# Patient Record
Sex: Female | Born: 1950 | Race: Black or African American | Hispanic: No | Smoking: Never smoker
Health system: Southern US, Community
[De-identification: ages and names within clinical notes are randomized; demographics above are authoritative.]

## PROBLEM LIST (undated history)

## (undated) DIAGNOSIS — I10 Essential (primary) hypertension: Secondary | ICD-10-CM

## (undated) DIAGNOSIS — E785 Hyperlipidemia, unspecified: Secondary | ICD-10-CM

## (undated) HISTORY — DX: Essential (primary) hypertension: I10

## (undated) HISTORY — DX: Hyperlipidemia, unspecified: E78.5

## (undated) HISTORY — PX: ABDOMINAL HYSTERECTOMY: SHX81

---

## 1999-12-14 ENCOUNTER — Other Ambulatory Visit: Admission: RE | Admit: 1999-12-14 | Discharge: 1999-12-14 | Payer: Self-pay | Admitting: Internal Medicine

## 1999-12-24 ENCOUNTER — Ambulatory Visit (HOSPITAL_COMMUNITY): Admission: RE | Admit: 1999-12-24 | Discharge: 1999-12-24 | Payer: Self-pay | Admitting: Internal Medicine

## 1999-12-24 ENCOUNTER — Encounter: Payer: Self-pay | Admitting: Internal Medicine

## 2005-03-15 ENCOUNTER — Ambulatory Visit (HOSPITAL_COMMUNITY): Admission: RE | Admit: 2005-03-15 | Discharge: 2005-03-15 | Payer: Self-pay | Admitting: Gastroenterology

## 2013-10-31 ENCOUNTER — Ambulatory Visit: Payer: 59

## 2013-10-31 ENCOUNTER — Ambulatory Visit (INDEPENDENT_AMBULATORY_CARE_PROVIDER_SITE_OTHER): Payer: 59 | Admitting: Family Medicine

## 2013-10-31 VITALS — BP 142/86 | HR 82 | Temp 98.9°F | Resp 16 | Ht 62.5 in | Wt 182.0 lb

## 2013-10-31 DIAGNOSIS — R059 Cough, unspecified: Secondary | ICD-10-CM

## 2013-10-31 DIAGNOSIS — R05 Cough: Secondary | ICD-10-CM

## 2013-10-31 DIAGNOSIS — J209 Acute bronchitis, unspecified: Secondary | ICD-10-CM

## 2013-10-31 LAB — POCT CBC
Granulocyte percent: 65.7 %G (ref 37–80)
HCT, POC: 40.9 % (ref 37.7–47.9)
Hemoglobin: 12.8 g/dL (ref 12.2–16.2)
Lymph, poc: 1.8 (ref 0.6–3.4)
MCH, POC: 29.2 pg (ref 27–31.2)
MCHC: 31.3 g/dL — AB (ref 31.8–35.4)
MCV: 93.1 fL (ref 80–97)
MID (cbc): 0.5 (ref 0–0.9)
MPV: 8.7 fL (ref 0–99.8)
POC Granulocyte: 4.4 (ref 2–6.9)
POC LYMPH PERCENT: 27.5 %L (ref 10–50)
POC MID %: 6.8 %M (ref 0–12)
Platelet Count, POC: 250 10*3/uL (ref 142–424)
RBC: 4.39 M/uL (ref 4.04–5.48)
RDW, POC: 13.9 %
WBC: 6.7 10*3/uL (ref 4.6–10.2)

## 2013-10-31 MED ORDER — ALBUTEROL SULFATE (2.5 MG/3ML) 0.083% IN NEBU
2.5000 mg | INHALATION_SOLUTION | Freq: Once | RESPIRATORY_TRACT | Status: AC
  Administered 2013-10-31: 2.5 mg via RESPIRATORY_TRACT

## 2013-10-31 MED ORDER — IPRATROPIUM BROMIDE 0.03 % NA SOLN: 2.0000 | Freq: Four times a day (QID) | NASAL | Status: DC

## 2013-10-31 MED ORDER — ALBUTEROL SULFATE HFA 108 (90 BASE) MCG/ACT IN AERS: 2.0000 | INHALATION_SPRAY | RESPIRATORY_TRACT | Status: DC | PRN

## 2013-10-31 MED ORDER — GUAIFENESIN ER 1200 MG PO TB12: 1.0000 | ORAL_TABLET | Freq: Two times a day (BID) | ORAL | Status: DC | PRN

## 2013-10-31 MED ORDER — HYDROCOD POLST-CHLORPHEN POLST 10-8 MG/5ML PO LQCR: 5.0000 mL | Freq: Two times a day (BID) | ORAL | Status: DC | PRN

## 2013-10-31 NOTE — Progress Notes (Signed)
Subjective:    Patient ID: Christina Duncan, female    DOB: 1950-11-21, 63 y.o.   MRN: [id number] Chief Complaint  Patient presents with  . Cough    x 4 day   HPI  This started w/ a little cough 4d ago then the next day developed a scratchy throat - started gargles. But that night she developed a subj f/c/sweats and her sxs worsen.  Coughing up yellow-green mucous w/ blood - same coming from her nose.  Her chest is wheezing.  Is not feeling SHoB, no CP though ribs are sore.  Not sleeping well due to sxs.  Left ear is hurting and HA, no sinus pressure and jaw pain.  No n/v.  Tol po, Nml GI/GU. Uing ricola cough drops and coricidin HBP for cold and flu.   Past Medical History  Diagnosis Date  . Hypertension    No current outpatient prescriptions on file prior to visit.   No current facility-administered medications on file prior to visit.   Allergies  Allergen Reactions  . Azithromycin Rash    Review of Systems  Constitutional: Positive for fever, chills, diaphoresis, activity change, appetite change and fatigue.  HENT: Positive for congestion, ear pain, nosebleeds, postnasal drip, rhinorrhea and sore throat. Negative for dental problem, hearing loss and sinus pressure.   Respiratory: Positive for cough and wheezing. Negative for chest tightness and shortness of breath.   Cardiovascular: Negative for chest pain, palpitations and leg swelling.  Gastrointestinal: Negative for nausea, vomiting, abdominal pain, diarrhea and constipation.  Genitourinary: Negative for dysuria, urgency, decreased urine volume and difficulty urinating.  Musculoskeletal: Positive for arthralgias. Negative for back pain.  Neurological: Positive for headaches.  Hematological: Negative for adenopathy.  Psychiatric/Behavioral: Positive for sleep disturbance.      BP 142/86  Pulse 82  Temp(Src) 98.9 F (37.2 C) (Oral)  Resp 16  Ht 5' 2.5" (1.588 m)  Wt 182 lb (82.555 kg)  BMI 32.74 kg/m2  SpO2  99% Objective:   Physical Exam  Constitutional: She is oriented to person, place, and time. She appears well-developed and well-nourished. No distress.  HENT:  Head: Normocephalic and atraumatic.  Right Ear: External ear and ear canal normal. A middle ear effusion is present.  Left Ear: External ear and ear canal normal. A middle ear effusion is present.  Nose: Mucosal edema and rhinorrhea present.  Mouth/Throat: Uvula is midline and mucous membranes are normal. Posterior oropharyngeal erythema present. No oropharyngeal exudate or posterior oropharyngeal edema.  Right ear w/ moderate amount of cerumen Left nare w/ friability  Eyes: Conjunctivae are normal. Right eye exhibits no discharge. Left eye exhibits no discharge. No scleral icterus.  Neck: Normal range of motion. Neck supple.  Cardiovascular: Normal rate, regular rhythm, normal heart sounds and intact distal pulses.   Pulmonary/Chest: Effort normal and breath sounds normal.  Lymphadenopathy:       Head (right side): No submandibular, no tonsillar, no preauricular and no posterior auricular adenopathy present.       Head (left side): No submandibular, no tonsillar, no preauricular and no posterior auricular adenopathy present.    She has no cervical adenopathy.       Right: No supraclavicular adenopathy present.       Left: No supraclavicular adenopathy present.  Neurological: She is alert and oriented to person, place, and time.  Skin: Skin is warm and dry. She is not diaphoretic. No erythema.  Psychiatric: She has a normal mood and affect. Her behavior is normal.  Results for orders placed in visit on 10/31/13  POCT CBC      Result Value Range   WBC 6.7  4.6 - 10.2 K/uL   Lymph, poc 1.8  0.6 - 3.4   POC LYMPH PERCENT 27.5  10 - 50 %L   MID (cbc) 0.5  0 - 0.9   POC MID % 6.8  0 - 12 %M   POC Granulocyte 4.4  2 - 6.9   Granulocyte percent 65.7  37 - 80 %G   RBC 4.39  4.04 - 5.48 M/uL   Hemoglobin 12.8  12.2 - 16.2 g/dL    HCT, POC 28.440.9  13.237.7 - 47.9 %   MCV 93.1  80 - 97 fL   MCH, POC 29.2  27 - 31.2 pg   MCHC 31.3 (*) 31.8 - 35.4 g/dL   RDW, POC 44.013.9     Platelet Count, POC 250  142 - 424 K/uL   MPV 8.7  0 - 99.8 fL      [facility] reading (PRIMARY) by  Dr. Clelia CroftShaw. CXR: no acute abnormality  EXAM: CHEST 2 VIEW  COMPARISON: None.  FINDINGS: The heart size and mediastinal contours are within normal limits. Both lungs are clear. The visualized skeletal structures are unremarkable.  IMPRESSION: No active cardiopulmonary disease.  Assessment & Plan:   Cough - Plan: POCT CBC, DG Chest 2 View, albuterol (PROVENTIL) (2.5 MG/3ML) 0.083% nebulizer solution 2.5 mg  Acute bronchitis - strongly suspect viral origin - symptomatic care.  Meds ordered this encounter  Medications  . DISCONTD: amLODipine (NORVASC) 10 MG tablet    Sig: Take 10 mg by mouth daily.  Marland Kitchen. amLODipine-benazepril (LOTREL) 10-20 MG per capsule    Sig: Take 1 capsule by mouth daily.  Marland Kitchen. albuterol (PROVENTIL) (2.5 MG/3ML) 0.083% nebulizer solution 2.5 mg    Sig:   . chlorpheniramine-HYDROcodone (TUSSIONEX PENNKINETIC ER) 10-8 MG/5ML LQCR    Sig: Take 5 mLs by mouth every 12 (twelve) hours as needed.    Dispense:  120 mL    Refill:  0  . albuterol (PROVENTIL HFA;VENTOLIN HFA) 108 (90 BASE) MCG/ACT inhaler    Sig: Inhale 2 puffs into the lungs every 4 (four) hours as needed for wheezing or shortness of breath (cough, shortness of breath or wheezing.).    Dispense:  1 Inhaler    Refill:  1  . ipratropium (ATROVENT) 0.03 % nasal spray    Sig: Place 2 sprays into the nose 4 (four) times daily.    Dispense:  30 mL    Refill:  1  . Guaifenesin (MUCINEX MAXIMUM STRENGTH) 1200 MG TB12    Sig: Take 1 tablet (1,200 mg total) by mouth every 12 (twelve) hours as needed.    Dispense:  14 tablet    Refill:  1    I personally performed the services described in this documentation, which was scribed in my presence. The recorded information has been  reviewed and considered, and addended by me as needed.  Norberto SorensonEva Nury Nebergall, MD MPH

## 2013-10-31 NOTE — Patient Instructions (Signed)
Acute Bronchitis Bronchitis is inflammation of the airways that extend from the windpipe into the lungs (bronchi). The inflammation often causes mucus to develop. This leads to a cough, which is the most common symptom of bronchitis.  In acute bronchitis, the condition usually develops suddenly and goes away over time, usually in a couple weeks. Smoking, allergies, and asthma can make bronchitis worse. Repeated episodes of bronchitis may cause further lung problems.  CAUSES Acute bronchitis is most often caused by the same virus that causes a cold. The virus can spread from person to person (contagious).  SIGNS AND SYMPTOMS   Cough.   Fever.   Coughing up mucus.   Body aches.   Chest congestion.   Chills.   Shortness of breath.   Sore throat.  DIAGNOSIS  Acute bronchitis is usually diagnosed through a physical exam. Tests, such as chest X-rays, are sometimes done to rule out other conditions.  TREATMENT  Acute bronchitis usually goes away in a couple weeks. Often times, no medical treatment is necessary. Medicines are sometimes given for relief of fever or cough. Antibiotics are usually not needed but may be prescribed in certain situations. In some cases, an inhaler may be recommended to help reduce shortness of breath and control the cough. A cool mist vaporizer may also be used to help thin bronchial secretions and make it easier to clear the chest.  HOME CARE INSTRUCTIONS  Get plenty of rest.   Drink enough fluids to keep your urine clear or pale yellow (unless you have a medical condition that requires fluid restriction). Increasing fluids may help thin your secretions and will prevent dehydration.   Only take over-the-counter or prescription medicines as directed by your health care provider.   Avoid smoking and secondhand smoke. Exposure to cigarette smoke or irritating chemicals will make bronchitis worse. If you are a smoker, consider using nicotine gum or skin  patches to help control withdrawal symptoms. Quitting smoking will help your lungs heal faster.   Reduce the chances of another bout of acute bronchitis by washing your hands frequently, avoiding people with cold symptoms, and trying not to touch your hands to your mouth, nose, or eyes.   Follow up with your health care provider as directed.  SEEK MEDICAL CARE IF: Your symptoms do not improve after 1 week of treatment.  SEEK IMMEDIATE MEDICAL CARE IF:  You develop an increased fever or chills.   You have chest pain.   You have severe shortness of breath.  You have bloody sputum.   You develop dehydration.  You develop fainting.  You develop repeated vomiting.  You develop a severe headache. MAKE SURE YOU:   Understand these instructions.  Will watch your condition.  Will get help right away if you are not doing well or get worse. Document Released: 11/14/2004 Document Revised: 06/09/2013 Document Reviewed: 03/30/2013 [employer] Patient Information 2014 [employer].  

## 2013-11-16 ENCOUNTER — Other Ambulatory Visit (HOSPITAL_COMMUNITY): Payer: Self-pay | Admitting: Internal Medicine

## 2013-11-16 DIAGNOSIS — Z1231 Encounter for screening mammogram for malignant neoplasm of breast: Secondary | ICD-10-CM

## 2013-11-17 ENCOUNTER — Ambulatory Visit (HOSPITAL_COMMUNITY)
Admission: RE | Admit: 2013-11-17 | Discharge: 2013-11-17 | Disposition: A | Discharge status: Home / Self Care | Payer: 59 | Source: Ambulatory Visit | Attending: Internal Medicine | Admitting: Internal Medicine

## 2013-11-17 DIAGNOSIS — Z1231 Encounter for screening mammogram for malignant neoplasm of breast: Secondary | ICD-10-CM

## 2013-11-19 ENCOUNTER — Other Ambulatory Visit: Payer: Self-pay | Admitting: Internal Medicine

## 2013-11-19 DIAGNOSIS — R928 Other abnormal and inconclusive findings on diagnostic imaging of breast: Secondary | ICD-10-CM

## 2013-11-29 ENCOUNTER — Other Ambulatory Visit: Payer: 59

## 2013-11-29 ENCOUNTER — Ambulatory Visit
Admission: RE | Admit: 2013-11-29 | Discharge: 2013-11-29 | Disposition: A | Discharge status: Home / Self Care | Payer: 59 | Source: Ambulatory Visit | Attending: Internal Medicine | Admitting: Internal Medicine

## 2013-11-29 DIAGNOSIS — R928 Other abnormal and inconclusive findings on diagnostic imaging of breast: Secondary | ICD-10-CM

## 2013-12-31 ENCOUNTER — Other Ambulatory Visit: Payer: Self-pay | Admitting: Family Medicine

## 2014-01-11 ENCOUNTER — Ambulatory Visit (INDEPENDENT_AMBULATORY_CARE_PROVIDER_SITE_OTHER): Payer: 59 | Admitting: Internal Medicine

## 2014-01-11 VITALS — BP 142/90 | HR 83 | Temp 98.2°F | Resp 18 | Ht 63.0 in | Wt 186.0 lb

## 2014-01-11 DIAGNOSIS — J029 Acute pharyngitis, unspecified: Secondary | ICD-10-CM

## 2014-01-11 DIAGNOSIS — J40 Bronchitis, not specified as acute or chronic: Secondary | ICD-10-CM

## 2014-01-11 MED ORDER — HYDROCODONE-ACETAMINOPHEN 7.5-325 MG/15ML PO SOLN: 5.0000 mL | Freq: Four times a day (QID) | ORAL | Status: DC | PRN

## 2014-01-11 MED ORDER — DOXYCYCLINE HYCLATE 100 MG PO TABS: 100.0000 mg | ORAL_TABLET | Freq: Two times a day (BID) | ORAL | Status: DC

## 2014-01-11 NOTE — Patient Instructions (Signed)
Acute Bronchitis Bronchitis is inflammation of the airways that extend from the windpipe into the lungs (bronchi). The inflammation often causes mucus to develop. This leads to a cough, which is the most common symptom of bronchitis.  In acute bronchitis, the condition usually develops suddenly and goes away over time, usually in a couple weeks. Smoking, allergies, and asthma can make bronchitis worse. Repeated episodes of bronchitis may cause further lung problems.  CAUSES Acute bronchitis is most often caused by the same virus that causes a cold. The virus can spread from person to person (contagious).  SIGNS AND SYMPTOMS   Cough.   Fever.   Coughing up mucus.   Body aches.   Chest congestion.   Chills.   Shortness of breath.   Sore throat.  DIAGNOSIS  Acute bronchitis is usually diagnosed through a physical exam. Tests, such as chest X-rays, are sometimes done to rule out other conditions.  TREATMENT  Acute bronchitis usually goes away in a couple weeks. Often times, no medical treatment is necessary. Medicines are sometimes given for relief of fever or cough. Antibiotics are usually not needed but may be prescribed in certain situations. In some cases, an inhaler may be recommended to help reduce shortness of breath and control the cough. A cool mist vaporizer may also be used to help thin bronchial secretions and make it easier to clear the chest.  HOME CARE INSTRUCTIONS  Get plenty of rest.   Drink enough fluids to keep your urine clear or pale yellow (unless you have a medical condition that requires fluid restriction). Increasing fluids may help thin your secretions and will prevent dehydration.   Only take over-the-counter or prescription medicines as directed by your health care provider.   Avoid smoking and secondhand smoke. Exposure to cigarette smoke or irritating chemicals will make bronchitis worse. If you are a smoker, consider using nicotine gum or skin  patches to help control withdrawal symptoms. Quitting smoking will help your lungs heal faster.   Reduce the chances of another bout of acute bronchitis by washing your hands frequently, avoiding people with cold symptoms, and trying not to touch your hands to your mouth, nose, or eyes.   Follow up with your health care provider as directed.  SEEK MEDICAL CARE IF: Your symptoms do not improve after 1 week of treatment.  SEEK IMMEDIATE MEDICAL CARE IF:  You develop an increased fever or chills.   You have chest pain.   You have severe shortness of breath.  You have bloody sputum.   You develop dehydration.  You develop fainting.  You develop repeated vomiting.  You develop a severe headache. MAKE SURE YOU:   Understand these instructions.  Will watch your condition.  Will get help right away if you are not doing well or get worse. Document Released: 11/14/2004 Document Revised: 06/09/2013 Document Reviewed: 03/30/2013 [employer] Patient Information 2014 [employer].  

## 2014-01-11 NOTE — Progress Notes (Signed)
   Subjective:    Patient ID: Christina Duncan, female    DOB: [birth year], 63 y.o.   MRN: [id number]  HPI Christina Duncan presents today with a cough, nasal congestion, and throat irritation. She has been sick for the past three days. She states it started with a scratchy throat. She has yellow mucous that she coughs up. No fever has been noted. She has been using hall cough drops and a nasal spray to assist with her symptoms.   Normal cxr in January.   Review of Systems     Objective:   Physical Exam  Vitals reviewed. Constitutional: She is oriented to person, place, and time. She appears well-developed and well-nourished. No distress.  HENT:  Head: Normocephalic.  Right Ear: External ear normal.  Left Ear: External ear normal.  Nose: Mucosal edema and rhinorrhea present.  Mouth/Throat: Oropharynx is clear and moist.  Eyes: EOM are normal.  Neck: Normal range of motion.  Cardiovascular: Normal rate.   Pulmonary/Chest: Effort normal. Not tachypneic. She has no decreased breath sounds. She has no wheezes. She has rhonchi. She has no rales.  Neurological: She is alert and oriented to person, place, and time. She exhibits normal muscle tone. Coordination normal.  Psychiatric: She has a normal mood and affect.          Assessment & Plan:  Doxycycline 100mg  bid/lortab elixir prn

## 2014-01-31 ENCOUNTER — Ambulatory Visit (INDEPENDENT_AMBULATORY_CARE_PROVIDER_SITE_OTHER): Payer: 59 | Admitting: Physician Assistant

## 2014-01-31 VITALS — BP 134/80 | HR 82 | Temp 98.1°F | Resp 18 | Ht 63.0 in | Wt 184.0 lb

## 2014-01-31 DIAGNOSIS — R3915 Urgency of urination: Secondary | ICD-10-CM

## 2014-01-31 DIAGNOSIS — R350 Frequency of micturition: Secondary | ICD-10-CM

## 2014-01-31 DIAGNOSIS — I10 Essential (primary) hypertension: Secondary | ICD-10-CM | POA: Insufficient documentation

## 2014-01-31 LAB — POCT URINALYSIS DIPSTICK
Bilirubin, UA: NEGATIVE
Glucose, UA: NEGATIVE
KETONES UA: NEGATIVE
NITRITE UA: NEGATIVE
PH UA: 6.5
Spec Grav, UA: 1.025
UROBILINOGEN UA: 0.2

## 2014-01-31 LAB — POCT UA - MICROSCOPIC ONLY
CASTS, UR, LPF, POC: NEGATIVE
Crystals, Ur, HPF, POC: NEGATIVE
Mucus, UA: NEGATIVE
Yeast, UA: NEGATIVE

## 2014-01-31 MED ORDER — CIPROFLOXACIN HCL 250 MG PO TABS
250.0000 mg | ORAL_TABLET | Freq: Two times a day (BID) | ORAL | Status: DC
Start: 2014-01-31 — End: 2014-11-17

## 2014-01-31 NOTE — Progress Notes (Signed)
I have examined this patient along with the student and agree.  

## 2014-01-31 NOTE — Progress Notes (Signed)
Subjective:    Patient ID: Christina Duncan, female    DOB: [birth year], 63 y.o.   MRN: [id number]  Urinary Frequency  Associated symptoms include frequency. Pertinent negatives include no flank pain.    62y.o female with hx of HTN for which she takes Lotrel presents with 3 days of urinary pressure at the end of urination, and increase in urinary frequency.  She describes waiting a long time until she got home to use the bathroom 3 days ago and since then has had a painful pressure at the end of urination.  She has also been urinating more than usual without changing intake of fluids.  Feels like she is not completely emptying bladder she does not go much each time. Denies burning, cloudiness, odor, blood, flank pain, vaginal discharge, sexual activity.  No thirsty feeling or family hx of diabetes.  No hx of urinary tract infections.  Denies fever, N/V/D.  Review of Systems  Constitutional: Negative.   HENT: Negative.   Eyes: Negative.   Respiratory: Negative.   Cardiovascular: Negative.   Gastrointestinal: Negative.   Endocrine: Negative.   Genitourinary: Positive for dysuria and frequency. Negative for flank pain.  Musculoskeletal: Negative.   Skin: Negative for rash.  Allergic/Immunologic: Negative.   Neurological: Negative.        Objective:   Physical Exam  Constitutional: She is oriented to person, place, and time. She appears well-developed and well-nourished. No distress.  HENT:  Head: Normocephalic.  Eyes: Conjunctivae are normal. Pupils are equal, round, and reactive to light.  Cardiovascular: Normal rate, regular rhythm, normal heart sounds and intact distal pulses.  Exam reveals no gallop and no friction rub.   No murmur heard. Pulmonary/Chest: Effort normal and breath sounds normal.  Abdominal: Soft. Bowel sounds are normal. There is tenderness (suprapubic) in the suprapubic area. There is no CVA tenderness.  Neurological: She is oriented to person, place, and time.    Psychiatric: She has a normal mood and affect. Her behavior is normal.    Results for orders placed in visit on 01/31/14  POCT URINALYSIS DIPSTICK      Result Value Ref Range   Color, UA yellow     Clarity, UA clear     Glucose, UA neg     Bilirubin, UA neg     Ketones, UA neg     Spec Grav, UA 1.025     Blood, UA large     pH, UA 6.5     Protein, UA >=300     Urobilinogen, UA 0.2     Nitrite, UA neg     Leukocytes, UA small (1+)    POCT UA - MICROSCOPIC ONLY      Result Value Ref Range   WBC, Ur, HPF, POC tntc     RBC, urine, microscopic tntc     Bacteria, U Microscopic 1+     Mucus, UA neg     Epithelial cells, urine per micros 0-2     Crystals, Ur, HPF, POC neg     Casts, Ur, LPF, POC neg     Yeast, UA neg           Assessment & Plan:   1. Frequent urination 2. Urgency of urination Likely UTI.  Will treat empirically with Cipro.  Awaiting culture results. - POCT urinalysis dipstick - POCT UA - Microscopic Only - Urine culture - ciprofloxacin (CIPRO) 250 MG tablet; Take 1 tablet (250 mg total) by mouth 2 (two) times daily.  Dispense: 10 tablet; Refill: 0

## 2014-01-31 NOTE — Patient Instructions (Signed)
I will contact you with your lab results as soon as they are available.   If you have not heard from me in 2 weeks, please contact me.  The fastest way to get your results is to register for My Chart (see the instructions on the last page of this printout).   

## 2014-02-02 LAB — URINE CULTURE

## 2014-11-17 ENCOUNTER — Ambulatory Visit (INDEPENDENT_AMBULATORY_CARE_PROVIDER_SITE_OTHER): Payer: 59 | Admitting: Physician Assistant

## 2014-11-17 VITALS — BP 144/86 | HR 73 | Temp 98.1°F | Resp 18 | Ht 62.0 in | Wt 186.8 lb

## 2014-11-17 DIAGNOSIS — R042 Hemoptysis: Secondary | ICD-10-CM

## 2014-11-17 DIAGNOSIS — S0120XA Unspecified open wound of nose, initial encounter: Secondary | ICD-10-CM

## 2014-11-17 NOTE — Patient Instructions (Signed)
1. Use over the counter nasal saline spray throughout day. Additionally use right before bed.

## 2014-11-17 NOTE — Progress Notes (Signed)
   Subjective:    Patient ID: Christina Duncan, female    DOB: 23-Nov-1950, 64 y.o.   MRN: [id number]  HPI Patient presents for blood in mucus that came out when she blew her nose and forced mucus to expel from mouth. Denies recent URI sx, fever, trauma, h/o nosebleeds, recent travel outside country, or known exposure to individual with TB. Has gas heating and air and feels that nose is very dry. Nose did not continue to bleed or no pain in nose.   BP slightly elevated in office and was also at home. Felt it was bc she was worried this event was serious. Took BP meds this morning and has follow up with PCP next week.   Review of Systems  Constitutional: Negative for fever.  HENT: Negative for congestion, ear pain, nosebleeds, rhinorrhea, sinus pressure, sneezing and sore throat.   Eyes: Negative for pain and discharge.  Respiratory: Negative for cough, chest tightness, shortness of breath and wheezing.   Cardiovascular: Negative for chest pain.  Neurological: Negative for dizziness and headaches.       Objective:   Physical Exam  Constitutional: She is oriented to person, place, and time. She appears well-developed and well-nourished. No distress.  Blood pressure 144/86, pulse 73, temperature 98.1 F (36.7 C), temperature source Oral, resp. rate 18, height 5\' 2"  (1.575 m), weight 186 lb 12.8 oz (84.732 kg), SpO2 98 %.  HENT:  Head: Normocephalic and atraumatic.  Right Ear: Tympanic membrane, external ear and ear canal normal.  Left Ear: Tympanic membrane, external ear and ear canal normal.  Nose: Nose lacerations (large scab present in left nares with small amount of blood anterior to scab) present. No mucosal edema, rhinorrhea, sinus tenderness, nasal deformity, septal deviation or nasal septal hematoma. Right sinus exhibits no maxillary sinus tenderness and no frontal sinus tenderness. Left sinus exhibits no maxillary sinus tenderness and no frontal sinus tenderness.  Mouth/Throat: Uvula  is midline, oropharynx is clear and moist and mucous membranes are normal. No oropharyngeal exudate.  Eyes: Conjunctivae are normal. Right eye exhibits no discharge. Left eye exhibits no discharge. No scleral icterus.  Cardiovascular: Normal rate, regular rhythm and normal heart sounds.  Exam reveals no gallop and no friction rub.   No murmur heard. Pulmonary/Chest: Effort normal and breath sounds normal. No respiratory distress. She has no decreased breath sounds. She has no wheezes. She has no rhonchi. She has no rales.  Neurological: She is alert and oriented to person, place, and time.  Skin: Skin is warm and dry. No rash noted. She is not diaphoretic. No erythema. No pallor.      Assessment & Plan:  1. Open wound, nose, without complication, initial encounter 2. Bloody sputum Nosebleed in sleep likely, especially, since has gas heating system. Should use nasal saline spray to keep nares moist. Should definitely use right before bed.   Christina Ridgeishira Zenobia Kuennen PA-C  [facility] 11/17/2014 12:58 PM

## 2015-06-22 ENCOUNTER — Other Ambulatory Visit: Payer: Self-pay

## 2015-06-22 DIAGNOSIS — Z1231 Encounter for screening mammogram for malignant neoplasm of breast: Secondary | ICD-10-CM

## 2015-07-04 ENCOUNTER — Ambulatory Visit
Admission: RE | Admit: 2015-07-04 | Discharge: 2015-07-04 | Disposition: A | Discharge status: Home / Self Care | Payer: Commercial Managed Care - HMO | Source: Ambulatory Visit

## 2015-07-04 DIAGNOSIS — Z1231 Encounter for screening mammogram for malignant neoplasm of breast: Secondary | ICD-10-CM

## 2016-06-11 DIAGNOSIS — E668 Other obesity: Secondary | ICD-10-CM | POA: Diagnosis not present

## 2016-06-11 DIAGNOSIS — I10 Essential (primary) hypertension: Secondary | ICD-10-CM | POA: Diagnosis not present

## 2016-06-11 DIAGNOSIS — E559 Vitamin D deficiency, unspecified: Secondary | ICD-10-CM | POA: Diagnosis not present

## 2016-06-11 DIAGNOSIS — E7800 Pure hypercholesterolemia, unspecified: Secondary | ICD-10-CM | POA: Diagnosis not present

## 2016-06-11 DIAGNOSIS — Z23 Encounter for immunization: Secondary | ICD-10-CM | POA: Diagnosis not present

## 2016-06-11 DIAGNOSIS — Z6834 Body mass index (BMI) 34.0-34.9, adult: Secondary | ICD-10-CM | POA: Diagnosis not present

## 2016-07-15 ENCOUNTER — Other Ambulatory Visit: Payer: Self-pay | Admitting: Internal Medicine

## 2016-07-15 DIAGNOSIS — Z1231 Encounter for screening mammogram for malignant neoplasm of breast: Secondary | ICD-10-CM

## 2016-07-26 ENCOUNTER — Ambulatory Visit
Admission: RE | Admit: 2016-07-26 | Discharge: 2016-07-26 | Disposition: A | Discharge status: Home / Self Care | Payer: PPO | Source: Ambulatory Visit | Attending: Internal Medicine | Admitting: Internal Medicine

## 2016-07-26 DIAGNOSIS — Z1231 Encounter for screening mammogram for malignant neoplasm of breast: Secondary | ICD-10-CM | POA: Diagnosis not present

## 2016-10-13 ENCOUNTER — Encounter (HOSPITAL_COMMUNITY): Payer: Self-pay | Admitting: Emergency Medicine

## 2016-10-13 ENCOUNTER — Emergency Department (HOSPITAL_COMMUNITY)
Admission: EM | Admit: 2016-10-13 | Discharge: 2016-10-13 | Disposition: A | Discharge status: Home / Self Care | Payer: PPO | Attending: Emergency Medicine | Admitting: Emergency Medicine

## 2016-10-13 DIAGNOSIS — Z7982 Long term (current) use of aspirin: Secondary | ICD-10-CM | POA: Insufficient documentation

## 2016-10-13 DIAGNOSIS — N3001 Acute cystitis with hematuria: Secondary | ICD-10-CM

## 2016-10-13 DIAGNOSIS — I10 Essential (primary) hypertension: Secondary | ICD-10-CM | POA: Diagnosis not present

## 2016-10-13 DIAGNOSIS — N390 Urinary tract infection, site not specified: Secondary | ICD-10-CM | POA: Diagnosis not present

## 2016-10-13 DIAGNOSIS — Z79899 Other long term (current) drug therapy: Secondary | ICD-10-CM | POA: Insufficient documentation

## 2016-10-13 LAB — URINALYSIS, ROUTINE W REFLEX MICROSCOPIC
BACTERIA UA: NONE SEEN
BILIRUBIN URINE: NEGATIVE
GLUCOSE, UA: NEGATIVE mg/dL
Ketones, ur: NEGATIVE mg/dL
NITRITE: NEGATIVE
PH: 6 (ref 5.0–8.0)
Protein, ur: 30 mg/dL — AB
SPECIFIC GRAVITY, URINE: 1.011 (ref 1.005–1.030)

## 2016-10-13 MED ORDER — CIPROFLOXACIN HCL 500 MG PO TABS
500.0000 mg | ORAL_TABLET | Freq: Once | ORAL | Status: AC
  Administered 2016-10-13: 500 mg via ORAL
  Filled 2016-10-13: qty 1

## 2016-10-13 MED ORDER — CIPROFLOXACIN HCL 500 MG PO TABS: 500.0000 mg | ORAL_TABLET | Freq: Two times a day (BID) | ORAL | 0 refills | Status: AC

## 2016-10-13 NOTE — ED Provider Notes (Signed)
[facility]-EMERGENCY DEPT Provider Note   CSN: [id number] Arrival date & time: 10/13/16  0707     History   Chief Complaint Chief Complaint  Patient presents with  . Urinary Tract Infection    HPI Christina Duncan is a 65 y.o. female history hypertension, UTI 2 years or here presenting with dysuria. She has been having dysuria for the last 2 days as well as suprapubic pain. He denies any fevers or flank pain. Denies any nausea or vomiting. He states that about 2 years ago she had UTI and finished a course of Cipro. Her urine culture at that time grew out Proteus That was sensitive to Cipro. Denies any hx of kidney stones   The history is provided by the spouse and the patient.    Past Medical History:  Diagnosis Date  . Hyperlipidemia   . Hypertension     Patient Active Problem List   Diagnosis Date Noted  . HTN (hypertension) 01/31/2014    Past Surgical History:  Procedure Laterality Date  . ABDOMINAL HYSTERECTOMY     1993    OB History    No data available       Home Medications    Prior to Admission medications   Medication Sig Start Date End Date Taking? Authorizing Provider  amLODipine-benazepril (LOTREL) 10-20 MG per capsule Take 1 capsule by mouth daily.   Yes Historical Provider, MD  aspirin EC 81 MG tablet Take 81 mg by mouth daily.   Yes Historical Provider, MD  cholecalciferol (VITAMIN D) 1000 UNITS tablet Take 1,000 Units by mouth daily.   Yes Historical Provider, MD  co-enzyme Q-10 30 MG capsule Take 30 mg by mouth 3 (three) times daily.   Yes Historical Provider, MD  rosuvastatin (CRESTOR) 20 MG tablet Take 20 mg by mouth daily. 09/18/16  Yes Historical Provider, MD    Family History Family History  Problem Relation Age of Onset  . Hypertension Mother   . Hypertension Father   . Stroke Father   . Peripheral vascular disease Father   . Cancer Father     prostate    Social History Social History  Substance Use Topics  . Smoking status: Never  Smoker  . Smokeless tobacco: Never Used  . Alcohol use No     Allergies   Shellfish allergy and Azithromycin   Review of Systems Review of Systems  Genitourinary: Positive for dysuria.  All other systems reviewed and are negative.    Physical Exam Updated Vital Signs BP 151/89 (BP Location: Right Arm)   Pulse 88   Temp 98.3 F (36.8 C) (Oral)   Resp 18   SpO2 97%   Physical Exam  Constitutional: She is oriented to person, place, and time. She appears well-developed and well-nourished.  Comfortable   HENT:  Head: Normocephalic.  Mouth/Throat: Oropharynx is clear and moist.  Eyes: EOM are normal. Pupils are equal, round, and reactive to light.  Neck: Normal range of motion. Neck supple.  Cardiovascular: Normal rate, regular rhythm and normal heart sounds.   Pulmonary/Chest: Effort normal and breath sounds normal. No respiratory distress. She has no wheezes. She has no rales.  Abdominal: Soft. Bowel sounds are normal.  + suprapubic tenderness. No CVAT   Musculoskeletal: Normal range of motion.  Neurological: She is alert and oriented to person, place, and time.  Skin: Skin is warm.  Psychiatric: She has a normal mood and affect.  Nursing note and vitals reviewed.    ED Treatments / Results  Labs (all labs ordered are listed, but only abnormal results are displayed) Labs Reviewed  URINALYSIS, ROUTINE W REFLEX MICROSCOPIC - Abnormal; Notable for the following:       Result Value   Color, Urine STRAW (*)    Hgb urine dipstick MODERATE (*)    Protein, ur 30 (*)    Leukocytes, UA MODERATE (*)    Squamous Epithelial / LPF 0-5 (*)    All other components within normal limits  URINE CULTURE    EKG  EKG Interpretation None       Radiology No results found.  Procedures Procedures (including critical care time)  Medications Ordered in ED Medications  ciprofloxacin (CIPRO) tablet 500 mg (500 mg Oral Given 10/13/16 [id number])     Initial Impression /  Assessment and Plan / ED Course  I have reviewed the triage vital signs and the nursing notes.  Pertinent labs & imaging results that were available during my care of the patient were reviewed by me and considered in my medical decision making (see chart for details).  Clinical Course    Christina ParkinMary N Pennock is a 65 y.o. female here with suprapubic pain, dysuria. UA + leuks and too many to count WBC and some blood. She has no fever or flank pain. I think likely UTI with hematuria. She has no hx of bladder stones or kidney stones. Previous urine culture sensitive to cipro and she states that cipro helped her before. Will give 5 days of cipro. Repeat urine culture sent. Will hold off on imaging as I have low suspicion for bladder or kidney stones. No need for labs since she is not vomiting and has no fever.     Final Clinical Impressions(s) / ED Diagnoses   Final diagnoses:  None    New Prescriptions New Prescriptions   No medications on file     Charlynne Panderavid Hsienta Yao, MD 10/13/16 [id number]

## 2016-10-13 NOTE — ED Triage Notes (Signed)
Pt sts dysuria x 2 days 

## 2016-10-13 NOTE — Discharge Instructions (Signed)
Stay hydrated.   Take tylenol, motrin for pain.   Take cipro 500 mg twice daily for 5 days.   See your doctor. Repeat urinalysis in a week after your symptoms resolved. You have some blood in urine and I want to make sure it resolved after the infection is treated or else you may need further workup and testing   Return to ER if you have worse dysuria, trouble urinating, flank pain, vomiting, fevers.

## 2016-10-14 LAB — URINE CULTURE

## 2016-11-22 DIAGNOSIS — H5203 Hypermetropia, bilateral: Secondary | ICD-10-CM | POA: Diagnosis not present

## 2016-11-26 ENCOUNTER — Ambulatory Visit (INDEPENDENT_AMBULATORY_CARE_PROVIDER_SITE_OTHER): Payer: PPO | Admitting: Family Medicine

## 2016-11-26 VITALS — BP 128/84 | HR 84 | Temp 98.4°F | Resp 17 | Ht 63.5 in | Wt 198.0 lb

## 2016-11-26 DIAGNOSIS — H6122 Impacted cerumen, left ear: Secondary | ICD-10-CM | POA: Diagnosis not present

## 2016-11-26 NOTE — Progress Notes (Signed)
By signing my name below, I, Mesha Guinyard, attest that this documentation has been prepared under the direction and in the presence of Meredith Staggers, MD.  Electronically Signed: Arvilla Market, Medical Scribe. 11/26/16. 10:06 AM.  Subjective:    Patient ID: Christina Duncan, female    DOB: 09-25-1951, 66 y.o.   MRN: [id number]  HPI Chief Complaint  Patient presents with  . Ear Pain    also clogged per patient     HPI Comments: Christina Duncan is a 66 y.o. female who presents to the [facility] complaining of left ear discomfort onset 3 days ago. Pt describes her ear discomfort as a blockage and it feels like she's under water. She used sweet oil over the weekend without success, and last night she used q-tips with peroxide with removal of some cerumen. The cerumen came to the external ear canal with the peroxide but she couldn't remove all the cerumen. She notes before the peroxide she couldn't hear the TV out of her left ear, and after she had some muffled hearing. Pt uses q-tips to clean her ears. Denies fever, rhinorrhea, ear pain, and congestion.  Tinnitis: She reports working at a loud job in the past and always hearing "crickets" in her ears.  Patient Active Problem List   Diagnosis Date Noted  . HTN (hypertension) 01/31/2014   Past Medical History:  Diagnosis Date  . Hyperlipidemia   . Hypertension    Past Surgical History:  Procedure Laterality Date  . ABDOMINAL HYSTERECTOMY     1993   Allergies  Allergen Reactions  . Shellfish Allergy Anaphylaxis and Swelling  . Azithromycin Rash   Prior to Admission medications   Medication Sig Start Date End Date Taking? Authorizing Provider  amLODipine-benazepril (LOTREL) 10-20 MG per capsule Take 1 capsule by mouth daily.   Yes Historical Provider, MD  aspirin EC 81 MG tablet Take 81 mg by mouth daily.   Yes Historical Provider, MD  cholecalciferol (VITAMIN D) 1000 UNITS tablet Take 1,000 Units by mouth daily.    Yes Historical Provider, MD  co-enzyme Q-10 30 MG capsule Take 30 mg by mouth 3 (three) times daily.   Yes Historical Provider, MD  rosuvastatin (CRESTOR) 20 MG tablet Take 20 mg by mouth daily. 09/18/16  Yes Historical Provider, MD   Social History   Social History  . Marital status: Divorced    Spouse name: n/a  . Number of children: 2  . Years of education: 12+   Occupational History  . retired   .  [employer]   Social History Main Topics  . Smoking status: Never Smoker  . Smokeless tobacco: Never Used  . Alcohol use No  . Drug use: No  . Sexual activity: Yes   Other Topics Concern  . Not on file   Social History Narrative   Divorced.  Lives with 36y.o son   Review of Systems  Constitutional: Negative for fever.  HENT: Positive for ear discharge, hearing loss and tinnitus. Negative for congestion, ear pain (discomfort) and rhinorrhea.    Objective:  Physical Exam  Constitutional: She appears well-developed and well-nourished. No distress.  HENT:  Head: Normocephalic and atraumatic.  Right TM pearly gray and clear Left canal occluded with dark brown cerumen Unable to visualized TM No erythema or exudate There was a small amount of dark brown cerumen removed with the white curette  Eyes: Conjunctivae are normal.  Neck: Neck supple.  Cardiovascular: Normal rate.  Pulmonary/Chest: Effort normal.  Neurological: She is alert.  Skin: Skin is warm and dry.  Psychiatric: She has a normal mood and affect. Her behavior is normal.  Nursing note and vitals reviewed.  BP 128/84 (BP Location: Right Arm, Patient Position: Sitting, Cuff Size: Normal)   Pulse 84   Temp 98.4 F (36.9 C) (Oral)   Resp 17   Ht 5' 3.5" (1.613 m)   Wt 198 lb (89.8 kg)   SpO2 97%   BMI 34.52 kg/m  Assessment & Plan:   Christina Duncan is a 66 y.o. female Hearing loss due to cerumen impaction, left - Plan: Ear wax removal  - Cerumen impaction, resolved after initial removal of small  amount of cerumen with white curette, then lavage by staff member after Colace drops. Hearing improved, TM intact without signs of rupture or discharge, no canal edema/erythema on second exam after procedure. RTC precautions given, handout given.  No orders of the defined types were placed in this encounter.  Patient Instructions    I suspect the noise in your left ear is due to ear wax, that should improve after treatment today. In the next week or 2 if you have return of any noise or change in hearing of the left ear, return to recheck as that may be a different condition than ear wax impaction. See other information below on earwax impaction. Return to the clinic or go to the nearest emergency room if any of your symptoms worsen or new symptoms occur.   Earwax Buildup Your ears make a substance called earwax. It may also be called cerumen. Sometimes, too much earwax builds up in your ear canal. This can cause ear pain and make it harder for you to hear. CAUSES This condition is caused by too much earwax production or buildup. RISK FACTORS The following factors may make you more likely to develop this condition:  Cleaning your ears often with swabs.  Having narrow ear canals.  Having earwax that is overly thick or sticky.  Having eczema.  Being dehydrated. SYMPTOMS Symptoms of this condition include:  Reduced hearing.  Ear drainage.  Ear pain.  Ear itch.  A feeling of fullness in the ear or feeling that the ear is plugged.  Ringing in the ear.  Coughing. DIAGNOSIS Your health care provider can diagnose this condition based on your symptoms and medical history. Your health care provider will also do an ear exam to look inside your ear with a scope (otoscope). You may also have a hearing test. TREATMENT Treatment for this condition includes:  Over-the-counter or prescription ear drops to soften the earwax.  Earwax removal by a health care provider. This may be  done:  By flushing the ear with body-temperature water.  With a medical instrument that has a loop at the end (earwax curette).  With a suction device. HOME CARE INSTRUCTIONS  Take over-the-counter and prescription medicines only as told by your health care provider.  Do not put any objects, including an ear swab, into your ear. You can clean the opening of your ear canal with a washcloth.  Drink enough water to keep your urine clear or pale yellow.  If you have frequent earwax buildup or you use hearing aids, consider seeing your health care provider every 6-12 months for routine preventive ear cleanings. Keep all follow-up visits as told by your health care provider. SEEK MEDICAL CARE IF:  You have ear pain.  Your condition does not improve with treatment.  You have hearing loss.  You have blood, pus, or other fluid coming from your ear. This information is not intended to replace advice given to you by your health care provider. Make sure you discuss any questions you have with your health care provider. Document Released: 11/14/2004 Document Revised: 01/29/2016 Document Reviewed: 05/24/2015 Elsevier Interactive Patient Education  2017 [employer]     IF you received an x-ray today, you will receive an invoice from [facility]. Please contact [facility] at [phone] with questions or concerns regarding your invoice.   IF you received labwork today, you will receive an invoice from [facility]. Please contact [facility] at [phone] with questions or concerns regarding your invoice.   Our billing staff will not be able to assist you with questions regarding bills from these companies.  You will be contacted with the lab results as soon as they are available. The fastest way to get your results is to activate your My Chart account. Instructions are located on the last page of this paperwork. If you have not heard from us regarding the results in 2 weeks,  please contact this office.       I personally performed the services described in this documentation, which was scribed in my presence. The recorded information has been reviewed and considered for accuracy and completeness, addended by me as needed, and agree with information above.  Signed,   Meredith StaggersJeffrey Patrcia Schnepp, MD [facility].  11/26/16 10:40 AM

## 2016-11-26 NOTE — Patient Instructions (Addendum)
I suspect the noise in your left ear is due to ear wax, that should improve after treatment today. In the next week or 2 if you have return of any noise or change in hearing of the left ear, return to recheck as that may be a different condition than ear wax impaction. See other information below on earwax impaction. Return to the clinic or go to the nearest emergency room if any of your symptoms worsen or new symptoms occur.   Earwax Buildup Your ears make a substance called earwax. It may also be called cerumen. Sometimes, too much earwax builds up in your ear canal. This can cause ear pain and make it harder for you to hear. CAUSES This condition is caused by too much earwax production or buildup. RISK FACTORS The following factors may make you more likely to develop this condition:  Cleaning your ears often with swabs.  Having narrow ear canals.  Having earwax that is overly thick or sticky.  Having eczema.  Being dehydrated. SYMPTOMS Symptoms of this condition include:  Reduced hearing.  Ear drainage.  Ear pain.  Ear itch.  A feeling of fullness in the ear or feeling that the ear is plugged.  Ringing in the ear.  Coughing. DIAGNOSIS Your health care provider can diagnose this condition based on your symptoms and medical history. Your health care provider will also do an ear exam to look inside your ear with a scope (otoscope). You may also have a hearing test. TREATMENT Treatment for this condition includes:  Over-the-counter or prescription ear drops to soften the earwax.  Earwax removal by a health care provider. This may be done:  By flushing the ear with body-temperature water.  With a medical instrument that has a loop at the end (earwax curette).  With a suction device. HOME CARE INSTRUCTIONS  Take over-the-counter and prescription medicines only as told by your health care provider.  Do not put any objects, including an ear swab, into your ear. You  can clean the opening of your ear canal with a washcloth.  Drink enough water to keep your urine clear or pale yellow.  If you have frequent earwax buildup or you use hearing aids, consider seeing your health care provider every 6-12 months for routine preventive ear cleanings. Keep all follow-up visits as told by your health care provider. SEEK MEDICAL CARE IF:  You have ear pain.  Your condition does not improve with treatment.  You have hearing loss.  You have blood, pus, or other fluid coming from your ear. This information is not intended to replace advice given to you by your health care provider. Make sure you discuss any questions you have with your health care provider. Document Released: 11/14/2004 Document Revised: 01/29/2016 Document Reviewed: 05/24/2015 Elsevier Interactive Patient Education  2017 [employer]     IF you received an x-ray today, you will receive an invoice from [facility]. Please contact [facility] at [phone] with questions or concerns regarding your invoice.   IF you received labwork today, you will receive an invoice from [facility]. Please contact [facility] at [phone] with questions or concerns regarding your invoice.   Our billing staff will not be able to assist you with questions regarding bills from these companies.  You will be contacted with the lab results as soon as they are available. The fastest way to get your results is to activate your My Chart account. Instructions are located on the last page of this paperwork. If  you have not heard from [place] regarding the results in 2 weeks, please contact this office.

## 2016-11-27 DIAGNOSIS — E559 Vitamin D deficiency, unspecified: Secondary | ICD-10-CM | POA: Diagnosis not present

## 2016-11-27 DIAGNOSIS — I10 Essential (primary) hypertension: Secondary | ICD-10-CM | POA: Diagnosis not present

## 2016-12-09 DIAGNOSIS — E559 Vitamin D deficiency, unspecified: Secondary | ICD-10-CM | POA: Diagnosis not present

## 2016-12-09 DIAGNOSIS — I10 Essential (primary) hypertension: Secondary | ICD-10-CM | POA: Diagnosis not present

## 2016-12-09 DIAGNOSIS — Z6834 Body mass index (BMI) 34.0-34.9, adult: Secondary | ICD-10-CM | POA: Diagnosis not present

## 2016-12-09 DIAGNOSIS — E7801 Familial hypercholesterolemia: Secondary | ICD-10-CM | POA: Diagnosis not present

## 2016-12-09 DIAGNOSIS — Z0000 Encounter for general adult medical examination without abnormal findings: Secondary | ICD-10-CM | POA: Diagnosis not present

## 2016-12-09 DIAGNOSIS — E668 Other obesity: Secondary | ICD-10-CM | POA: Diagnosis not present

## 2016-12-11 DIAGNOSIS — M1711 Unilateral primary osteoarthritis, right knee: Secondary | ICD-10-CM | POA: Diagnosis not present

## 2016-12-11 DIAGNOSIS — M25561 Pain in right knee: Secondary | ICD-10-CM | POA: Diagnosis not present

## 2016-12-13 DIAGNOSIS — Z1212 Encounter for screening for malignant neoplasm of rectum: Secondary | ICD-10-CM | POA: Diagnosis not present

## 2017-01-29 DIAGNOSIS — M1711 Unilateral primary osteoarthritis, right knee: Secondary | ICD-10-CM | POA: Diagnosis not present

## 2017-01-29 DIAGNOSIS — M25561 Pain in right knee: Secondary | ICD-10-CM | POA: Diagnosis not present

## 2017-06-19 DIAGNOSIS — E7801 Familial hypercholesterolemia: Secondary | ICD-10-CM | POA: Diagnosis not present

## 2017-06-19 DIAGNOSIS — I10 Essential (primary) hypertension: Secondary | ICD-10-CM | POA: Diagnosis not present

## 2017-06-19 DIAGNOSIS — E668 Other obesity: Secondary | ICD-10-CM | POA: Diagnosis not present

## 2017-06-19 DIAGNOSIS — Z1389 Encounter for screening for other disorder: Secondary | ICD-10-CM | POA: Diagnosis not present

## 2017-06-19 DIAGNOSIS — E559 Vitamin D deficiency, unspecified: Secondary | ICD-10-CM | POA: Diagnosis not present

## 2017-06-19 DIAGNOSIS — D692 Other nonthrombocytopenic purpura: Secondary | ICD-10-CM | POA: Diagnosis not present

## 2017-06-19 DIAGNOSIS — Z23 Encounter for immunization: Secondary | ICD-10-CM | POA: Diagnosis not present

## 2017-06-19 DIAGNOSIS — Z6833 Body mass index (BMI) 33.0-33.9, adult: Secondary | ICD-10-CM | POA: Diagnosis not present

## 2017-06-25 ENCOUNTER — Other Ambulatory Visit: Payer: Self-pay | Admitting: Internal Medicine

## 2017-06-25 DIAGNOSIS — Z1231 Encounter for screening mammogram for malignant neoplasm of breast: Secondary | ICD-10-CM

## 2017-07-28 ENCOUNTER — Ambulatory Visit
Admission: RE | Admit: 2017-07-28 | Discharge: 2017-07-28 | Disposition: A | Discharge status: Home / Self Care | Payer: PPO | Source: Ambulatory Visit | Attending: Internal Medicine | Admitting: Internal Medicine

## 2017-07-28 DIAGNOSIS — Z1231 Encounter for screening mammogram for malignant neoplasm of breast: Secondary | ICD-10-CM

## 2017-08-19 DIAGNOSIS — E669 Obesity, unspecified: Secondary | ICD-10-CM | POA: Diagnosis not present

## 2017-08-19 DIAGNOSIS — R1310 Dysphagia, unspecified: Secondary | ICD-10-CM | POA: Diagnosis not present

## 2017-08-19 DIAGNOSIS — Z1211 Encounter for screening for malignant neoplasm of colon: Secondary | ICD-10-CM | POA: Diagnosis not present

## 2017-12-03 DIAGNOSIS — R82998 Other abnormal findings in urine: Secondary | ICD-10-CM | POA: Diagnosis not present

## 2017-12-03 DIAGNOSIS — E559 Vitamin D deficiency, unspecified: Secondary | ICD-10-CM | POA: Diagnosis not present

## 2017-12-03 DIAGNOSIS — I10 Essential (primary) hypertension: Secondary | ICD-10-CM | POA: Diagnosis not present

## 2017-12-03 DIAGNOSIS — E7801 Familial hypercholesterolemia: Secondary | ICD-10-CM | POA: Diagnosis not present

## 2017-12-11 DIAGNOSIS — Z1389 Encounter for screening for other disorder: Secondary | ICD-10-CM | POA: Diagnosis not present

## 2017-12-11 DIAGNOSIS — E668 Other obesity: Secondary | ICD-10-CM | POA: Diagnosis not present

## 2017-12-11 DIAGNOSIS — Z23 Encounter for immunization: Secondary | ICD-10-CM | POA: Diagnosis not present

## 2017-12-11 DIAGNOSIS — Z1212 Encounter for screening for malignant neoplasm of rectum: Secondary | ICD-10-CM | POA: Diagnosis not present

## 2017-12-11 DIAGNOSIS — Z6833 Body mass index (BMI) 33.0-33.9, adult: Secondary | ICD-10-CM | POA: Diagnosis not present

## 2017-12-11 DIAGNOSIS — E7801 Familial hypercholesterolemia: Secondary | ICD-10-CM | POA: Diagnosis not present

## 2017-12-11 DIAGNOSIS — D692 Other nonthrombocytopenic purpura: Secondary | ICD-10-CM | POA: Diagnosis not present

## 2017-12-11 DIAGNOSIS — Z0000 Encounter for general adult medical examination without abnormal findings: Secondary | ICD-10-CM | POA: Diagnosis not present

## 2017-12-11 DIAGNOSIS — I10 Essential (primary) hypertension: Secondary | ICD-10-CM | POA: Diagnosis not present

## 2017-12-11 DIAGNOSIS — E559 Vitamin D deficiency, unspecified: Secondary | ICD-10-CM | POA: Diagnosis not present

## 2018-03-12 DIAGNOSIS — K08409 Partial loss of teeth, unspecified cause, unspecified class: Secondary | ICD-10-CM | POA: Diagnosis not present

## 2018-03-12 DIAGNOSIS — Z8249 Family history of ischemic heart disease and other diseases of the circulatory system: Secondary | ICD-10-CM | POA: Diagnosis not present

## 2018-03-12 DIAGNOSIS — Z809 Family history of malignant neoplasm, unspecified: Secondary | ICD-10-CM | POA: Diagnosis not present

## 2018-03-12 DIAGNOSIS — Z833 Family history of diabetes mellitus: Secondary | ICD-10-CM | POA: Diagnosis not present

## 2018-03-12 DIAGNOSIS — E785 Hyperlipidemia, unspecified: Secondary | ICD-10-CM | POA: Diagnosis not present

## 2018-03-12 DIAGNOSIS — G8929 Other chronic pain: Secondary | ICD-10-CM | POA: Diagnosis not present

## 2018-03-12 DIAGNOSIS — Z7722 Contact with and (suspected) exposure to environmental tobacco smoke (acute) (chronic): Secondary | ICD-10-CM | POA: Diagnosis not present

## 2018-03-12 DIAGNOSIS — Z823 Family history of stroke: Secondary | ICD-10-CM | POA: Diagnosis not present

## 2018-03-12 DIAGNOSIS — I10 Essential (primary) hypertension: Secondary | ICD-10-CM | POA: Diagnosis not present

## 2018-03-12 DIAGNOSIS — E669 Obesity, unspecified: Secondary | ICD-10-CM | POA: Diagnosis not present

## 2018-06-10 DIAGNOSIS — I10 Essential (primary) hypertension: Secondary | ICD-10-CM | POA: Diagnosis not present

## 2018-06-10 DIAGNOSIS — E7801 Familial hypercholesterolemia: Secondary | ICD-10-CM | POA: Diagnosis not present

## 2018-06-10 DIAGNOSIS — Z6833 Body mass index (BMI) 33.0-33.9, adult: Secondary | ICD-10-CM | POA: Diagnosis not present

## 2018-06-10 DIAGNOSIS — E668 Other obesity: Secondary | ICD-10-CM | POA: Diagnosis not present

## 2018-06-10 DIAGNOSIS — D692 Other nonthrombocytopenic purpura: Secondary | ICD-10-CM | POA: Diagnosis not present

## 2018-06-10 DIAGNOSIS — E559 Vitamin D deficiency, unspecified: Secondary | ICD-10-CM | POA: Diagnosis not present

## 2018-07-25 DIAGNOSIS — Z23 Encounter for immunization: Secondary | ICD-10-CM | POA: Diagnosis not present

## 2018-08-04 ENCOUNTER — Other Ambulatory Visit: Payer: Self-pay | Admitting: Internal Medicine

## 2018-08-04 DIAGNOSIS — Z1231 Encounter for screening mammogram for malignant neoplasm of breast: Secondary | ICD-10-CM

## 2018-08-12 ENCOUNTER — Ambulatory Visit
Admission: RE | Admit: 2018-08-12 | Discharge: 2018-08-12 | Disposition: A | Discharge status: Home / Self Care | Payer: Medicare HMO | Source: Ambulatory Visit | Attending: Internal Medicine | Admitting: Internal Medicine

## 2018-08-12 DIAGNOSIS — Z1231 Encounter for screening mammogram for malignant neoplasm of breast: Secondary | ICD-10-CM

## 2018-12-09 DIAGNOSIS — R82998 Other abnormal findings in urine: Secondary | ICD-10-CM | POA: Diagnosis not present

## 2018-12-09 DIAGNOSIS — E559 Vitamin D deficiency, unspecified: Secondary | ICD-10-CM | POA: Diagnosis not present

## 2018-12-09 DIAGNOSIS — I10 Essential (primary) hypertension: Secondary | ICD-10-CM | POA: Diagnosis not present

## 2018-12-15 DIAGNOSIS — Z1212 Encounter for screening for malignant neoplasm of rectum: Secondary | ICD-10-CM | POA: Diagnosis not present

## 2018-12-16 DIAGNOSIS — D692 Other nonthrombocytopenic purpura: Secondary | ICD-10-CM | POA: Diagnosis not present

## 2018-12-16 DIAGNOSIS — Z23 Encounter for immunization: Secondary | ICD-10-CM | POA: Diagnosis not present

## 2018-12-16 DIAGNOSIS — Z1389 Encounter for screening for other disorder: Secondary | ICD-10-CM | POA: Diagnosis not present

## 2018-12-16 DIAGNOSIS — N182 Chronic kidney disease, stage 2 (mild): Secondary | ICD-10-CM | POA: Diagnosis not present

## 2018-12-16 DIAGNOSIS — E559 Vitamin D deficiency, unspecified: Secondary | ICD-10-CM | POA: Diagnosis not present

## 2018-12-16 DIAGNOSIS — E668 Other obesity: Secondary | ICD-10-CM | POA: Diagnosis not present

## 2018-12-16 DIAGNOSIS — I129 Hypertensive chronic kidney disease with stage 1 through stage 4 chronic kidney disease, or unspecified chronic kidney disease: Secondary | ICD-10-CM | POA: Diagnosis not present

## 2018-12-16 DIAGNOSIS — E7801 Familial hypercholesterolemia: Secondary | ICD-10-CM | POA: Diagnosis not present

## 2018-12-16 DIAGNOSIS — R808 Other proteinuria: Secondary | ICD-10-CM | POA: Diagnosis not present

## 2018-12-16 DIAGNOSIS — Z0000 Encounter for general adult medical examination without abnormal findings: Secondary | ICD-10-CM | POA: Diagnosis not present

## 2019-07-03 DIAGNOSIS — Z23 Encounter for immunization: Secondary | ICD-10-CM | POA: Diagnosis not present

## 2019-07-07 DIAGNOSIS — E7801 Familial hypercholesterolemia: Secondary | ICD-10-CM | POA: Diagnosis not present

## 2019-07-07 DIAGNOSIS — E669 Obesity, unspecified: Secondary | ICD-10-CM | POA: Diagnosis not present

## 2019-07-07 DIAGNOSIS — I129 Hypertensive chronic kidney disease with stage 1 through stage 4 chronic kidney disease, or unspecified chronic kidney disease: Secondary | ICD-10-CM | POA: Diagnosis not present

## 2019-07-07 DIAGNOSIS — N182 Chronic kidney disease, stage 2 (mild): Secondary | ICD-10-CM | POA: Diagnosis not present

## 2019-07-07 DIAGNOSIS — R809 Proteinuria, unspecified: Secondary | ICD-10-CM | POA: Diagnosis not present

## 2019-07-07 DIAGNOSIS — E559 Vitamin D deficiency, unspecified: Secondary | ICD-10-CM | POA: Diagnosis not present

## 2019-07-07 DIAGNOSIS — D692 Other nonthrombocytopenic purpura: Secondary | ICD-10-CM | POA: Diagnosis not present

## 2019-07-12 DIAGNOSIS — E7801 Familial hypercholesterolemia: Secondary | ICD-10-CM | POA: Diagnosis not present

## 2019-07-12 DIAGNOSIS — E559 Vitamin D deficiency, unspecified: Secondary | ICD-10-CM | POA: Diagnosis not present

## 2019-07-12 DIAGNOSIS — I129 Hypertensive chronic kidney disease with stage 1 through stage 4 chronic kidney disease, or unspecified chronic kidney disease: Secondary | ICD-10-CM | POA: Diagnosis not present

## 2019-07-15 ENCOUNTER — Other Ambulatory Visit: Payer: Self-pay | Admitting: Internal Medicine

## 2019-07-15 DIAGNOSIS — Z1231 Encounter for screening mammogram for malignant neoplasm of breast: Secondary | ICD-10-CM

## 2019-07-16 ENCOUNTER — Other Ambulatory Visit: Payer: Self-pay

## 2019-07-16 DIAGNOSIS — R6889 Other general symptoms and signs: Secondary | ICD-10-CM | POA: Diagnosis not present

## 2019-07-16 DIAGNOSIS — Z20822 Contact with and (suspected) exposure to covid-19: Secondary | ICD-10-CM

## 2019-07-17 LAB — NOVEL CORONAVIRUS, NAA: SARS-CoV-2, NAA: NOT DETECTED

## 2019-08-02 DIAGNOSIS — M1711 Unilateral primary osteoarthritis, right knee: Secondary | ICD-10-CM | POA: Diagnosis not present

## 2019-08-02 DIAGNOSIS — M25561 Pain in right knee: Secondary | ICD-10-CM | POA: Diagnosis not present

## 2019-09-01 ENCOUNTER — Ambulatory Visit
Admission: RE | Admit: 2019-09-01 | Discharge: 2019-09-01 | Disposition: A | Discharge status: Home / Self Care | Payer: Medicare HMO | Source: Ambulatory Visit | Attending: Internal Medicine | Admitting: Internal Medicine

## 2019-09-01 ENCOUNTER — Other Ambulatory Visit: Payer: Self-pay

## 2019-09-01 DIAGNOSIS — Z1231 Encounter for screening mammogram for malignant neoplasm of breast: Secondary | ICD-10-CM

## 2019-10-28 ENCOUNTER — Ambulatory Visit: Payer: Medicare HMO | Attending: Internal Medicine

## 2019-10-28 DIAGNOSIS — Z20822 Contact with and (suspected) exposure to covid-19: Secondary | ICD-10-CM | POA: Diagnosis not present

## 2019-10-30 LAB — NOVEL CORONAVIRUS, NAA: SARS-CoV-2, NAA: NOT DETECTED

## 2019-12-12 ENCOUNTER — Ambulatory Visit: Payer: Medicare HMO | Attending: Internal Medicine

## 2019-12-12 DIAGNOSIS — Z23 Encounter for immunization: Secondary | ICD-10-CM

## 2019-12-12 NOTE — Progress Notes (Signed)
   Covid-19 Vaccination Clinic  Name:  Christina Duncan    MRN: [id number] DOB: 01-Aug-1951  12/12/2019  [name] was observed post Covid-19 immunization for 15 minutes without incidence. She was provided with Vaccine Information Sheet and instruction to access the V-Safe system.   [name] was instructed to call 911 with any severe reactions post vaccine: Marland Kitchen Difficulty breathing  . Swelling of your face and throat  . A fast heartbeat  . A bad rash all over your body  . Dizziness and weakness    Immunizations Administered    Name Date Dose VIS Date Route   Pfizer COVID-19 Vaccine 12/12/2019 12:48 PM 0.3 mL 10/01/2019 Intramuscular   Manufacturer: [employer], [employer]   Lot: [id number]   NDC: [address]-0

## 2019-12-20 DIAGNOSIS — E559 Vitamin D deficiency, unspecified: Secondary | ICD-10-CM | POA: Diagnosis not present

## 2019-12-20 DIAGNOSIS — Z0000 Encounter for general adult medical examination without abnormal findings: Secondary | ICD-10-CM | POA: Diagnosis not present

## 2019-12-20 DIAGNOSIS — E7801 Familial hypercholesterolemia: Secondary | ICD-10-CM | POA: Diagnosis not present

## 2019-12-24 DIAGNOSIS — I129 Hypertensive chronic kidney disease with stage 1 through stage 4 chronic kidney disease, or unspecified chronic kidney disease: Secondary | ICD-10-CM | POA: Diagnosis not present

## 2019-12-24 DIAGNOSIS — R82998 Other abnormal findings in urine: Secondary | ICD-10-CM | POA: Diagnosis not present

## 2019-12-27 DIAGNOSIS — Z1331 Encounter for screening for depression: Secondary | ICD-10-CM | POA: Diagnosis not present

## 2019-12-27 DIAGNOSIS — N182 Chronic kidney disease, stage 2 (mild): Secondary | ICD-10-CM | POA: Diagnosis not present

## 2019-12-27 DIAGNOSIS — I129 Hypertensive chronic kidney disease with stage 1 through stage 4 chronic kidney disease, or unspecified chronic kidney disease: Secondary | ICD-10-CM | POA: Diagnosis not present

## 2019-12-27 DIAGNOSIS — R809 Proteinuria, unspecified: Secondary | ICD-10-CM | POA: Diagnosis not present

## 2019-12-27 DIAGNOSIS — D692 Other nonthrombocytopenic purpura: Secondary | ICD-10-CM | POA: Diagnosis not present

## 2019-12-27 DIAGNOSIS — Z1339 Encounter for screening examination for other mental health and behavioral disorders: Secondary | ICD-10-CM | POA: Diagnosis not present

## 2019-12-27 DIAGNOSIS — Z0000 Encounter for general adult medical examination without abnormal findings: Secondary | ICD-10-CM | POA: Diagnosis not present

## 2019-12-27 DIAGNOSIS — E559 Vitamin D deficiency, unspecified: Secondary | ICD-10-CM | POA: Diagnosis not present

## 2019-12-27 DIAGNOSIS — E7801 Familial hypercholesterolemia: Secondary | ICD-10-CM | POA: Diagnosis not present

## 2019-12-27 DIAGNOSIS — E669 Obesity, unspecified: Secondary | ICD-10-CM | POA: Diagnosis not present

## 2020-01-03 DIAGNOSIS — Z1212 Encounter for screening for malignant neoplasm of rectum: Secondary | ICD-10-CM | POA: Diagnosis not present

## 2020-01-05 ENCOUNTER — Ambulatory Visit: Payer: Medicare HMO | Attending: Internal Medicine

## 2020-01-05 DIAGNOSIS — Z23 Encounter for immunization: Secondary | ICD-10-CM

## 2020-01-05 NOTE — Progress Notes (Signed)
   Covid-19 Vaccination Clinic  Name:  Christina Duncan    MRN: [id number] DOB: [birth year]  01/05/2020  [name] was observed post Covid-19 immunization for 15 minutes without incident. She was provided with Vaccine Information Sheet and instruction to access the V-Safe system.   [name] was instructed to call 911 with any severe reactions post vaccine: Marland Kitchen Difficulty breathing  . Swelling of face and throat  . A fast heartbeat  . A bad rash all over body  . Dizziness and weakness   Immunizations Administered    Name Date Dose VIS Date Route   Pfizer COVID-19 Vaccine 01/05/2020  9:08 AM 0.3 mL 10/01/2019 Intramuscular   Manufacturer: [employer], [employer]   Lot: [id number]   NDC: [address]-1

## 2020-01-07 DIAGNOSIS — H5201 Hypermetropia, right eye: Secondary | ICD-10-CM | POA: Diagnosis not present

## 2020-01-07 DIAGNOSIS — H52203 Unspecified astigmatism, bilateral: Secondary | ICD-10-CM | POA: Diagnosis not present

## 2020-01-07 DIAGNOSIS — H524 Presbyopia: Secondary | ICD-10-CM | POA: Diagnosis not present

## 2020-04-07 DIAGNOSIS — M79642 Pain in left hand: Secondary | ICD-10-CM | POA: Diagnosis not present

## 2020-04-07 DIAGNOSIS — G5602 Carpal tunnel syndrome, left upper limb: Secondary | ICD-10-CM | POA: Diagnosis not present

## 2020-07-04 DIAGNOSIS — E559 Vitamin D deficiency, unspecified: Secondary | ICD-10-CM | POA: Diagnosis not present

## 2020-07-04 DIAGNOSIS — E669 Obesity, unspecified: Secondary | ICD-10-CM | POA: Diagnosis not present

## 2020-07-04 DIAGNOSIS — N182 Chronic kidney disease, stage 2 (mild): Secondary | ICD-10-CM | POA: Diagnosis not present

## 2020-07-04 DIAGNOSIS — Z6831 Body mass index (BMI) 31.0-31.9, adult: Secondary | ICD-10-CM | POA: Diagnosis not present

## 2020-07-04 DIAGNOSIS — Z23 Encounter for immunization: Secondary | ICD-10-CM | POA: Diagnosis not present

## 2020-07-04 DIAGNOSIS — D692 Other nonthrombocytopenic purpura: Secondary | ICD-10-CM | POA: Diagnosis not present

## 2020-07-04 DIAGNOSIS — I129 Hypertensive chronic kidney disease with stage 1 through stage 4 chronic kidney disease, or unspecified chronic kidney disease: Secondary | ICD-10-CM | POA: Diagnosis not present

## 2020-07-04 DIAGNOSIS — E7801 Familial hypercholesterolemia: Secondary | ICD-10-CM | POA: Diagnosis not present

## 2020-07-04 DIAGNOSIS — R809 Proteinuria, unspecified: Secondary | ICD-10-CM | POA: Diagnosis not present

## 2020-08-02 ENCOUNTER — Other Ambulatory Visit: Payer: Self-pay | Admitting: Internal Medicine

## 2020-08-02 DIAGNOSIS — Z1231 Encounter for screening mammogram for malignant neoplasm of breast: Secondary | ICD-10-CM

## 2020-09-05 ENCOUNTER — Ambulatory Visit
Admission: RE | Admit: 2020-09-05 | Discharge: 2020-09-05 | Disposition: A | Discharge status: Home / Self Care | Payer: Medicare HMO | Source: Ambulatory Visit | Attending: Internal Medicine | Admitting: Internal Medicine

## 2020-09-05 ENCOUNTER — Other Ambulatory Visit: Payer: Self-pay

## 2020-09-05 DIAGNOSIS — Z1231 Encounter for screening mammogram for malignant neoplasm of breast: Secondary | ICD-10-CM

## 2020-11-02 DIAGNOSIS — Z20822 Contact with and (suspected) exposure to covid-19: Secondary | ICD-10-CM | POA: Diagnosis not present

## 2020-11-02 DIAGNOSIS — U071 COVID-19: Secondary | ICD-10-CM | POA: Diagnosis not present

## 2020-11-03 IMAGING — MG DIGITAL SCREENING BILAT W/ TOMO W/ CAD
8 series · 8 of 24 positions shown · non-contrast
Comparison: Previous exam(s).

CLINICAL DATA: Screening.

EXAM:
DIGITAL SCREENING BILATERAL MAMMOGRAM WITH TOMO AND CAD

[L CC synth-2D]
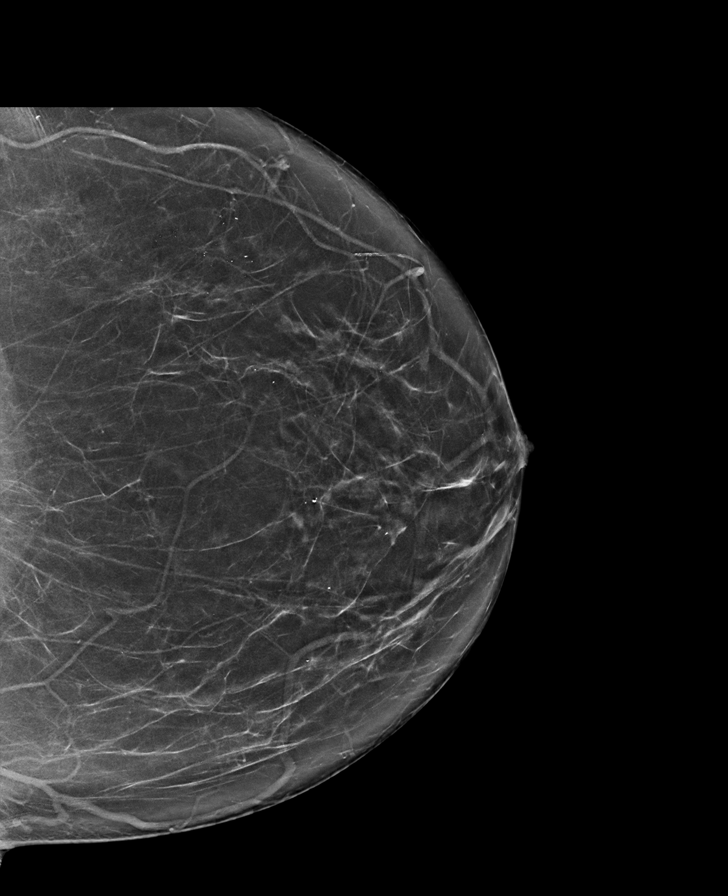

[R CC synth-2D]
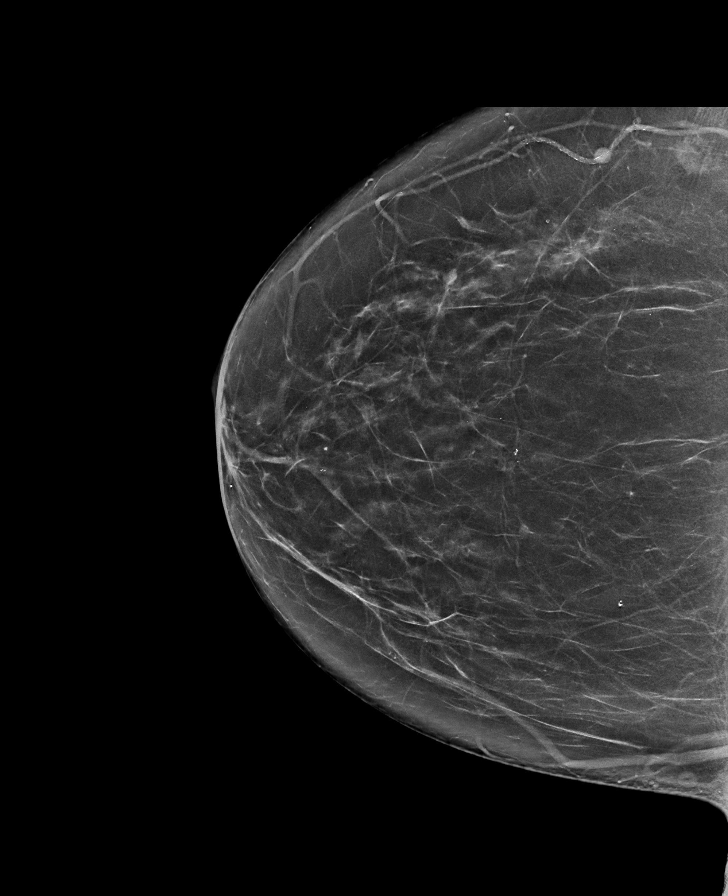

[R MLO synth-2D]
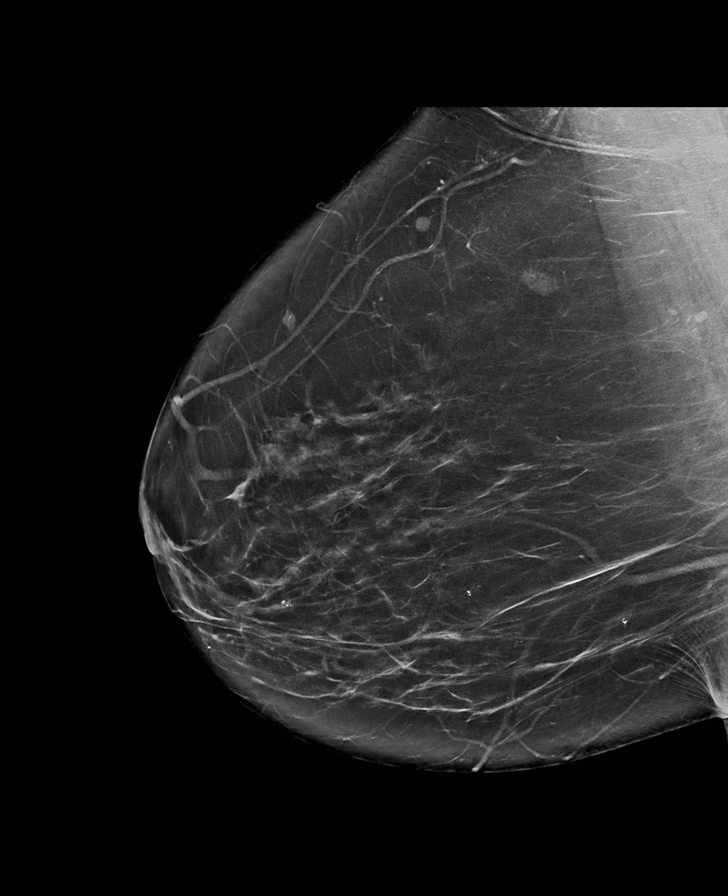

[L MLO synth-2D]
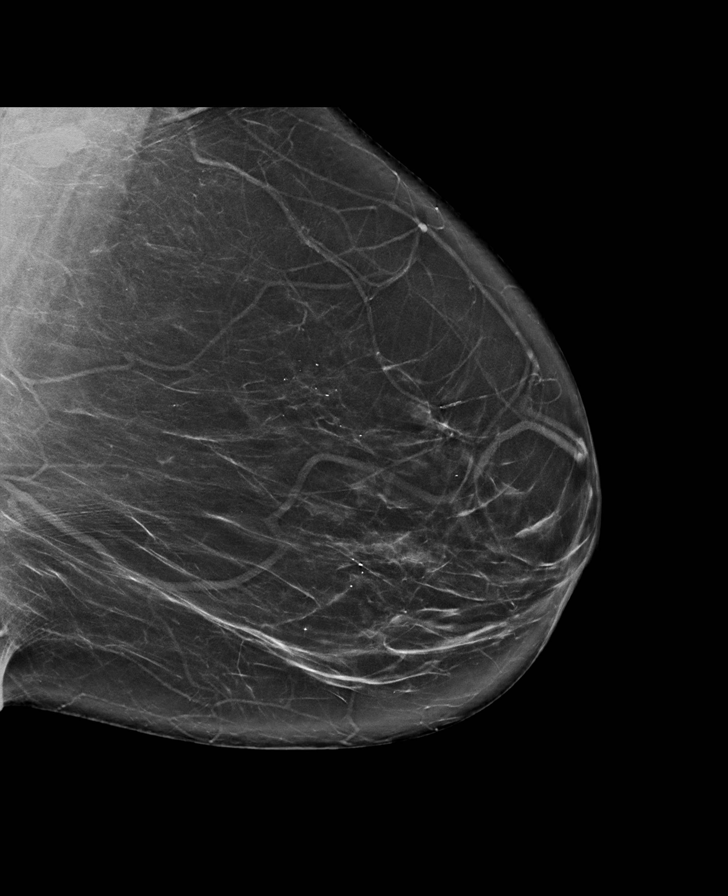

[L CC tomo · tomo slice 37/74.0]
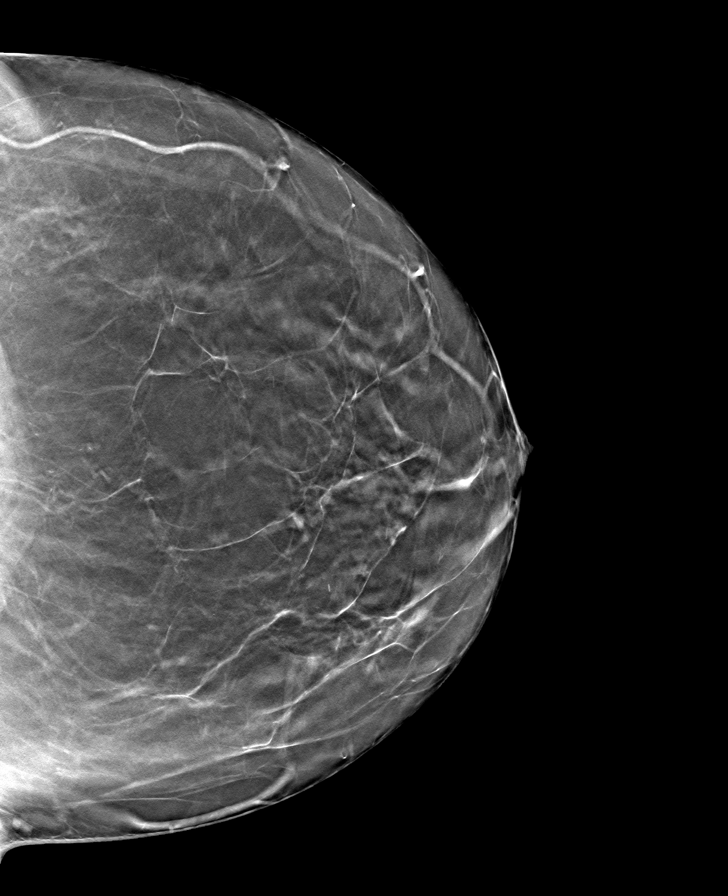

[R CC tomo · tomo slice 37/73.0]
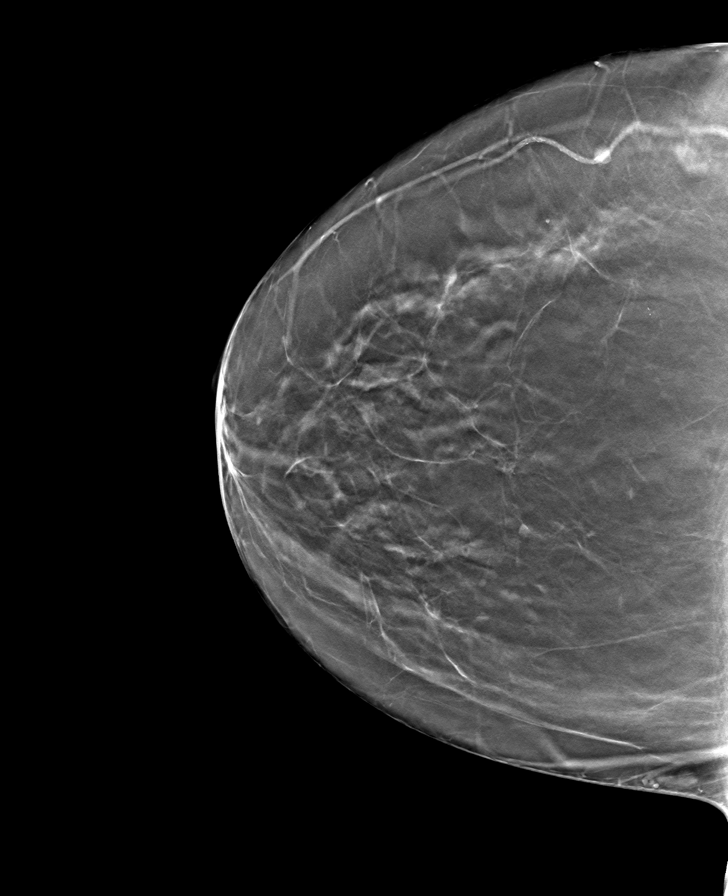

[R MLO tomo · tomo slice 43/85.0]
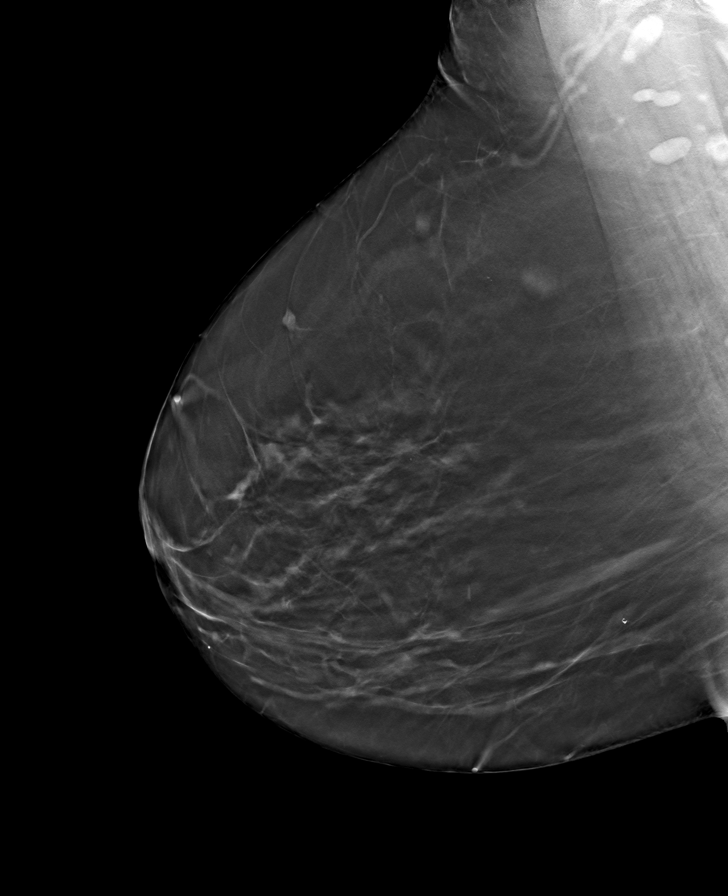

[L MLO tomo · tomo slice 43/84.0]
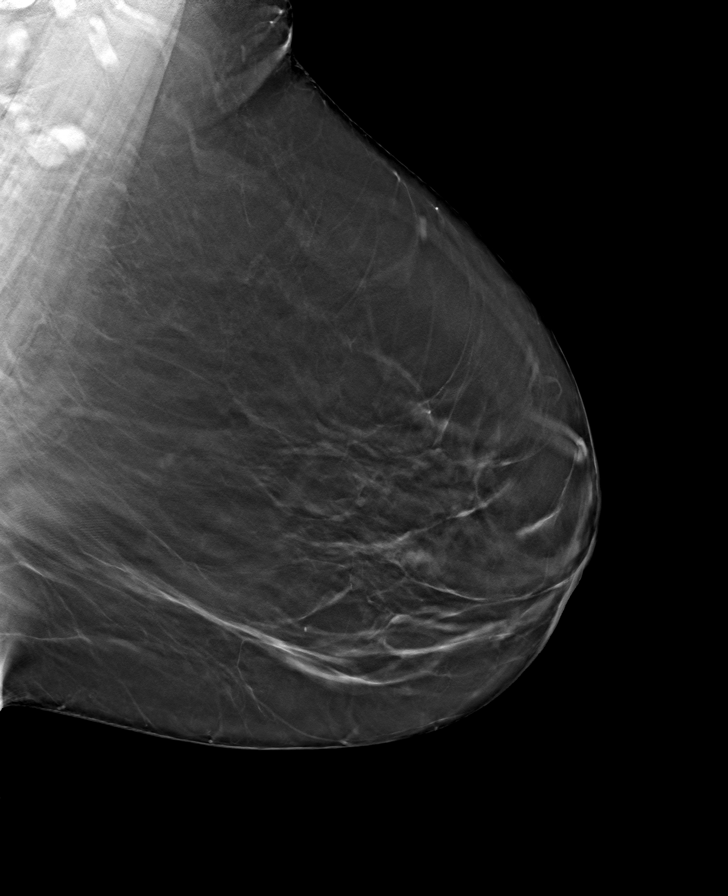

[8 of 24 positions shown; findings below may reference images not displayed]

ACR Breast Density Category b: There are scattered areas of
fibroglandular density.
FINDINGS: There are no findings suspicious for malignancy. Images were
processed with CAD.
IMPRESSION: No mammographic evidence of malignancy. A result letter of this
screening mammogram will be mailed directly to the patient.

RECOMMENDATION:
Screening mammogram in one year. (Code:[id number])

BI-RADS CATEGORY  1: Negative.

## 2020-12-21 DIAGNOSIS — E7801 Familial hypercholesterolemia: Secondary | ICD-10-CM | POA: Diagnosis not present

## 2020-12-21 DIAGNOSIS — E559 Vitamin D deficiency, unspecified: Secondary | ICD-10-CM | POA: Diagnosis not present

## 2020-12-28 DIAGNOSIS — E669 Obesity, unspecified: Secondary | ICD-10-CM | POA: Diagnosis not present

## 2020-12-28 DIAGNOSIS — E559 Vitamin D deficiency, unspecified: Secondary | ICD-10-CM | POA: Diagnosis not present

## 2020-12-28 DIAGNOSIS — Z1331 Encounter for screening for depression: Secondary | ICD-10-CM | POA: Diagnosis not present

## 2020-12-28 DIAGNOSIS — E7801 Familial hypercholesterolemia: Secondary | ICD-10-CM | POA: Diagnosis not present

## 2020-12-28 DIAGNOSIS — I129 Hypertensive chronic kidney disease with stage 1 through stage 4 chronic kidney disease, or unspecified chronic kidney disease: Secondary | ICD-10-CM | POA: Diagnosis not present

## 2020-12-28 DIAGNOSIS — Z0000 Encounter for general adult medical examination without abnormal findings: Secondary | ICD-10-CM | POA: Diagnosis not present

## 2020-12-28 DIAGNOSIS — R809 Proteinuria, unspecified: Secondary | ICD-10-CM | POA: Diagnosis not present

## 2020-12-28 DIAGNOSIS — R82998 Other abnormal findings in urine: Secondary | ICD-10-CM | POA: Diagnosis not present

## 2020-12-28 DIAGNOSIS — D692 Other nonthrombocytopenic purpura: Secondary | ICD-10-CM | POA: Diagnosis not present

## 2020-12-28 DIAGNOSIS — Z1339 Encounter for screening examination for other mental health and behavioral disorders: Secondary | ICD-10-CM | POA: Diagnosis not present

## 2020-12-28 DIAGNOSIS — Z1212 Encounter for screening for malignant neoplasm of rectum: Secondary | ICD-10-CM | POA: Diagnosis not present

## 2020-12-28 DIAGNOSIS — Z6831 Body mass index (BMI) 31.0-31.9, adult: Secondary | ICD-10-CM | POA: Diagnosis not present

## 2020-12-28 DIAGNOSIS — N182 Chronic kidney disease, stage 2 (mild): Secondary | ICD-10-CM | POA: Diagnosis not present

## 2021-01-08 DIAGNOSIS — H5203 Hypermetropia, bilateral: Secondary | ICD-10-CM | POA: Diagnosis not present

## 2021-01-08 DIAGNOSIS — H52203 Unspecified astigmatism, bilateral: Secondary | ICD-10-CM | POA: Diagnosis not present

## 2021-01-08 DIAGNOSIS — H524 Presbyopia: Secondary | ICD-10-CM | POA: Diagnosis not present

## 2021-01-08 DIAGNOSIS — H25813 Combined forms of age-related cataract, bilateral: Secondary | ICD-10-CM | POA: Diagnosis not present

## 2021-07-04 DIAGNOSIS — E669 Obesity, unspecified: Secondary | ICD-10-CM | POA: Diagnosis not present

## 2021-07-04 DIAGNOSIS — I129 Hypertensive chronic kidney disease with stage 1 through stage 4 chronic kidney disease, or unspecified chronic kidney disease: Secondary | ICD-10-CM | POA: Diagnosis not present

## 2021-07-04 DIAGNOSIS — Z23 Encounter for immunization: Secondary | ICD-10-CM | POA: Diagnosis not present

## 2021-07-04 DIAGNOSIS — N182 Chronic kidney disease, stage 2 (mild): Secondary | ICD-10-CM | POA: Diagnosis not present

## 2021-07-04 DIAGNOSIS — D692 Other nonthrombocytopenic purpura: Secondary | ICD-10-CM | POA: Diagnosis not present

## 2021-07-04 DIAGNOSIS — E559 Vitamin D deficiency, unspecified: Secondary | ICD-10-CM | POA: Diagnosis not present

## 2021-07-04 DIAGNOSIS — E7801 Familial hypercholesterolemia: Secondary | ICD-10-CM | POA: Diagnosis not present

## 2021-07-04 DIAGNOSIS — R809 Proteinuria, unspecified: Secondary | ICD-10-CM | POA: Diagnosis not present

## 2021-08-03 ENCOUNTER — Other Ambulatory Visit: Payer: Self-pay | Admitting: Internal Medicine

## 2021-08-03 DIAGNOSIS — Z1231 Encounter for screening mammogram for malignant neoplasm of breast: Secondary | ICD-10-CM

## 2021-09-06 ENCOUNTER — Ambulatory Visit
Admission: RE | Admit: 2021-09-06 | Discharge: 2021-09-06 | Disposition: A | Discharge status: Home / Self Care | Payer: Medicare HMO | Source: Ambulatory Visit | Attending: Internal Medicine | Admitting: Internal Medicine

## 2021-09-06 ENCOUNTER — Other Ambulatory Visit: Payer: Self-pay

## 2021-09-06 DIAGNOSIS — Z1231 Encounter for screening mammogram for malignant neoplasm of breast: Secondary | ICD-10-CM | POA: Diagnosis not present

## 2022-07-30 ENCOUNTER — Other Ambulatory Visit: Payer: Self-pay | Admitting: Internal Medicine

## 2022-07-30 DIAGNOSIS — Z1231 Encounter for screening mammogram for malignant neoplasm of breast: Secondary | ICD-10-CM

## 2022-09-23 ENCOUNTER — Ambulatory Visit
Admission: RE | Admit: 2022-09-23 | Discharge: 2022-09-23 | Disposition: A | Discharge status: Home / Self Care | Payer: Medicare Other | Source: Ambulatory Visit | Attending: Internal Medicine | Admitting: Internal Medicine

## 2022-09-23 DIAGNOSIS — Z1231 Encounter for screening mammogram for malignant neoplasm of breast: Secondary | ICD-10-CM

## 2022-11-29 DIAGNOSIS — M1711 Unilateral primary osteoarthritis, right knee: Secondary | ICD-10-CM | POA: Diagnosis not present

## 2023-01-06 DIAGNOSIS — M1711 Unilateral primary osteoarthritis, right knee: Secondary | ICD-10-CM | POA: Diagnosis not present

## 2023-01-13 DIAGNOSIS — H25813 Combined forms of age-related cataract, bilateral: Secondary | ICD-10-CM | POA: Diagnosis not present

## 2023-01-13 DIAGNOSIS — H52203 Unspecified astigmatism, bilateral: Secondary | ICD-10-CM | POA: Diagnosis not present

## 2023-01-13 DIAGNOSIS — H5203 Hypermetropia, bilateral: Secondary | ICD-10-CM | POA: Diagnosis not present

## 2023-01-13 DIAGNOSIS — H524 Presbyopia: Secondary | ICD-10-CM | POA: Diagnosis not present

## 2023-01-22 DIAGNOSIS — E559 Vitamin D deficiency, unspecified: Secondary | ICD-10-CM | POA: Diagnosis not present

## 2023-01-22 DIAGNOSIS — Z1212 Encounter for screening for malignant neoplasm of rectum: Secondary | ICD-10-CM | POA: Diagnosis not present

## 2023-01-22 DIAGNOSIS — R7989 Other specified abnormal findings of blood chemistry: Secondary | ICD-10-CM | POA: Diagnosis not present

## 2023-01-22 DIAGNOSIS — D692 Other nonthrombocytopenic purpura: Secondary | ICD-10-CM | POA: Diagnosis not present

## 2023-01-22 DIAGNOSIS — E7801 Familial hypercholesterolemia: Secondary | ICD-10-CM | POA: Diagnosis not present

## 2023-01-29 DIAGNOSIS — E7801 Familial hypercholesterolemia: Secondary | ICD-10-CM | POA: Diagnosis not present

## 2023-01-29 DIAGNOSIS — M1711 Unilateral primary osteoarthritis, right knee: Secondary | ICD-10-CM | POA: Diagnosis not present

## 2023-01-29 DIAGNOSIS — Z1339 Encounter for screening examination for other mental health and behavioral disorders: Secondary | ICD-10-CM | POA: Diagnosis not present

## 2023-01-29 DIAGNOSIS — E559 Vitamin D deficiency, unspecified: Secondary | ICD-10-CM | POA: Diagnosis not present

## 2023-01-29 DIAGNOSIS — E663 Overweight: Secondary | ICD-10-CM | POA: Diagnosis not present

## 2023-01-29 DIAGNOSIS — I129 Hypertensive chronic kidney disease with stage 1 through stage 4 chronic kidney disease, or unspecified chronic kidney disease: Secondary | ICD-10-CM | POA: Diagnosis not present

## 2023-01-29 DIAGNOSIS — N182 Chronic kidney disease, stage 2 (mild): Secondary | ICD-10-CM | POA: Diagnosis not present

## 2023-01-29 DIAGNOSIS — D692 Other nonthrombocytopenic purpura: Secondary | ICD-10-CM | POA: Diagnosis not present

## 2023-01-29 DIAGNOSIS — E876 Hypokalemia: Secondary | ICD-10-CM | POA: Diagnosis not present

## 2023-01-29 DIAGNOSIS — R82998 Other abnormal findings in urine: Secondary | ICD-10-CM | POA: Diagnosis not present

## 2023-01-29 DIAGNOSIS — Z0000 Encounter for general adult medical examination without abnormal findings: Secondary | ICD-10-CM | POA: Diagnosis not present

## 2023-01-29 DIAGNOSIS — Z1331 Encounter for screening for depression: Secondary | ICD-10-CM | POA: Diagnosis not present

## 2023-02-03 DIAGNOSIS — M1711 Unilateral primary osteoarthritis, right knee: Secondary | ICD-10-CM | POA: Diagnosis not present

## 2023-02-03 DIAGNOSIS — G8918 Other acute postprocedural pain: Secondary | ICD-10-CM | POA: Diagnosis not present

## 2023-02-06 DIAGNOSIS — M25561 Pain in right knee: Secondary | ICD-10-CM | POA: Diagnosis not present

## 2023-02-11 DIAGNOSIS — M25561 Pain in right knee: Secondary | ICD-10-CM | POA: Diagnosis not present

## 2023-02-17 DIAGNOSIS — M25561 Pain in right knee: Secondary | ICD-10-CM | POA: Diagnosis not present

## 2023-02-19 DIAGNOSIS — M25561 Pain in right knee: Secondary | ICD-10-CM | POA: Diagnosis not present

## 2023-02-25 DIAGNOSIS — M25561 Pain in right knee: Secondary | ICD-10-CM | POA: Diagnosis not present

## 2023-02-27 DIAGNOSIS — M25561 Pain in right knee: Secondary | ICD-10-CM | POA: Diagnosis not present

## 2023-03-04 DIAGNOSIS — M25561 Pain in right knee: Secondary | ICD-10-CM | POA: Diagnosis not present

## 2023-03-06 DIAGNOSIS — M25561 Pain in right knee: Secondary | ICD-10-CM | POA: Diagnosis not present

## 2023-03-11 DIAGNOSIS — M25561 Pain in right knee: Secondary | ICD-10-CM | POA: Diagnosis not present

## 2023-03-13 DIAGNOSIS — M25561 Pain in right knee: Secondary | ICD-10-CM | POA: Diagnosis not present

## 2023-03-26 DIAGNOSIS — Z96651 Presence of right artificial knee joint: Secondary | ICD-10-CM | POA: Diagnosis not present

## 2023-03-26 DIAGNOSIS — Z471 Aftercare following joint replacement surgery: Secondary | ICD-10-CM | POA: Diagnosis not present

## 2023-07-29 DIAGNOSIS — E669 Obesity, unspecified: Secondary | ICD-10-CM | POA: Diagnosis not present

## 2023-07-29 DIAGNOSIS — I129 Hypertensive chronic kidney disease with stage 1 through stage 4 chronic kidney disease, or unspecified chronic kidney disease: Secondary | ICD-10-CM | POA: Diagnosis not present

## 2023-07-29 DIAGNOSIS — E876 Hypokalemia: Secondary | ICD-10-CM | POA: Diagnosis not present

## 2023-07-29 DIAGNOSIS — Z0000 Encounter for general adult medical examination without abnormal findings: Secondary | ICD-10-CM | POA: Diagnosis not present

## 2023-07-29 DIAGNOSIS — Z23 Encounter for immunization: Secondary | ICD-10-CM | POA: Diagnosis not present

## 2023-07-29 DIAGNOSIS — Z1389 Encounter for screening for other disorder: Secondary | ICD-10-CM | POA: Diagnosis not present

## 2023-07-29 DIAGNOSIS — M1711 Unilateral primary osteoarthritis, right knee: Secondary | ICD-10-CM | POA: Diagnosis not present

## 2023-07-29 DIAGNOSIS — R809 Proteinuria, unspecified: Secondary | ICD-10-CM | POA: Diagnosis not present

## 2023-07-29 DIAGNOSIS — D692 Other nonthrombocytopenic purpura: Secondary | ICD-10-CM | POA: Diagnosis not present

## 2023-07-29 DIAGNOSIS — E7801 Familial hypercholesterolemia: Secondary | ICD-10-CM | POA: Diagnosis not present

## 2023-07-29 DIAGNOSIS — N182 Chronic kidney disease, stage 2 (mild): Secondary | ICD-10-CM | POA: Diagnosis not present

## 2023-07-29 DIAGNOSIS — E559 Vitamin D deficiency, unspecified: Secondary | ICD-10-CM | POA: Diagnosis not present

## 2023-07-29 DIAGNOSIS — Z6831 Body mass index (BMI) 31.0-31.9, adult: Secondary | ICD-10-CM | POA: Diagnosis not present

## 2023-08-13 ENCOUNTER — Other Ambulatory Visit: Payer: Self-pay | Admitting: Internal Medicine

## 2023-08-13 DIAGNOSIS — Z1231 Encounter for screening mammogram for malignant neoplasm of breast: Secondary | ICD-10-CM

## 2023-09-25 ENCOUNTER — Ambulatory Visit
Admission: RE | Admit: 2023-09-25 | Discharge: 2023-09-25 | Disposition: A | Discharge status: Home / Self Care | Payer: HMO | Source: Ambulatory Visit | Attending: Internal Medicine | Admitting: Internal Medicine

## 2023-09-25 DIAGNOSIS — Z1231 Encounter for screening mammogram for malignant neoplasm of breast: Secondary | ICD-10-CM

## 2024-02-05 DIAGNOSIS — E785 Hyperlipidemia, unspecified: Secondary | ICD-10-CM | POA: Diagnosis not present

## 2024-02-05 DIAGNOSIS — E7801 Familial hypercholesterolemia: Secondary | ICD-10-CM | POA: Diagnosis not present

## 2024-02-05 DIAGNOSIS — I129 Hypertensive chronic kidney disease with stage 1 through stage 4 chronic kidney disease, or unspecified chronic kidney disease: Secondary | ICD-10-CM | POA: Diagnosis not present

## 2024-02-05 DIAGNOSIS — N182 Chronic kidney disease, stage 2 (mild): Secondary | ICD-10-CM | POA: Diagnosis not present

## 2024-02-05 DIAGNOSIS — E559 Vitamin D deficiency, unspecified: Secondary | ICD-10-CM | POA: Diagnosis not present

## 2024-02-05 DIAGNOSIS — Z1212 Encounter for screening for malignant neoplasm of rectum: Secondary | ICD-10-CM | POA: Diagnosis not present

## 2024-02-13 DIAGNOSIS — Z1339 Encounter for screening examination for other mental health and behavioral disorders: Secondary | ICD-10-CM | POA: Diagnosis not present

## 2024-02-13 DIAGNOSIS — E669 Obesity, unspecified: Secondary | ICD-10-CM | POA: Diagnosis not present

## 2024-02-13 DIAGNOSIS — N182 Chronic kidney disease, stage 2 (mild): Secondary | ICD-10-CM | POA: Diagnosis not present

## 2024-02-13 DIAGNOSIS — Z1331 Encounter for screening for depression: Secondary | ICD-10-CM | POA: Diagnosis not present

## 2024-02-13 DIAGNOSIS — E559 Vitamin D deficiency, unspecified: Secondary | ICD-10-CM | POA: Diagnosis not present

## 2024-02-13 DIAGNOSIS — I129 Hypertensive chronic kidney disease with stage 1 through stage 4 chronic kidney disease, or unspecified chronic kidney disease: Secondary | ICD-10-CM | POA: Diagnosis not present

## 2024-02-13 DIAGNOSIS — R809 Proteinuria, unspecified: Secondary | ICD-10-CM | POA: Diagnosis not present

## 2024-02-13 DIAGNOSIS — M1711 Unilateral primary osteoarthritis, right knee: Secondary | ICD-10-CM | POA: Diagnosis not present

## 2024-02-13 DIAGNOSIS — Z0000 Encounter for general adult medical examination without abnormal findings: Secondary | ICD-10-CM | POA: Diagnosis not present

## 2024-02-13 DIAGNOSIS — E7801 Familial hypercholesterolemia: Secondary | ICD-10-CM | POA: Diagnosis not present

## 2024-02-13 DIAGNOSIS — D692 Other nonthrombocytopenic purpura: Secondary | ICD-10-CM | POA: Diagnosis not present

## 2024-02-13 DIAGNOSIS — R82998 Other abnormal findings in urine: Secondary | ICD-10-CM | POA: Diagnosis not present

## 2024-05-04 DIAGNOSIS — Z1211 Encounter for screening for malignant neoplasm of colon: Secondary | ICD-10-CM | POA: Diagnosis not present

## 2024-05-04 DIAGNOSIS — R195 Other fecal abnormalities: Secondary | ICD-10-CM | POA: Diagnosis not present

## 2024-05-24 DIAGNOSIS — Z800 Family history of malignant neoplasm of digestive organs: Secondary | ICD-10-CM | POA: Diagnosis not present

## 2024-05-24 DIAGNOSIS — K5730 Diverticulosis of large intestine without perforation or abscess without bleeding: Secondary | ICD-10-CM | POA: Diagnosis not present

## 2024-05-24 DIAGNOSIS — D175 Benign lipomatous neoplasm of intra-abdominal organs: Secondary | ICD-10-CM | POA: Diagnosis not present

## 2024-05-24 DIAGNOSIS — Z1211 Encounter for screening for malignant neoplasm of colon: Secondary | ICD-10-CM | POA: Diagnosis not present

## 2024-08-10 DIAGNOSIS — M1711 Unilateral primary osteoarthritis, right knee: Secondary | ICD-10-CM | POA: Diagnosis not present

## 2024-08-10 DIAGNOSIS — E559 Vitamin D deficiency, unspecified: Secondary | ICD-10-CM | POA: Diagnosis not present

## 2024-08-10 DIAGNOSIS — E669 Obesity, unspecified: Secondary | ICD-10-CM | POA: Diagnosis not present

## 2024-08-10 DIAGNOSIS — R809 Proteinuria, unspecified: Secondary | ICD-10-CM | POA: Diagnosis not present

## 2024-08-10 DIAGNOSIS — Z23 Encounter for immunization: Secondary | ICD-10-CM | POA: Diagnosis not present

## 2024-08-10 DIAGNOSIS — E876 Hypokalemia: Secondary | ICD-10-CM | POA: Diagnosis not present

## 2024-08-10 DIAGNOSIS — D692 Other nonthrombocytopenic purpura: Secondary | ICD-10-CM | POA: Diagnosis not present

## 2024-08-10 DIAGNOSIS — N182 Chronic kidney disease, stage 2 (mild): Secondary | ICD-10-CM | POA: Diagnosis not present

## 2024-08-10 DIAGNOSIS — I129 Hypertensive chronic kidney disease with stage 1 through stage 4 chronic kidney disease, or unspecified chronic kidney disease: Secondary | ICD-10-CM | POA: Diagnosis not present

## 2024-08-10 DIAGNOSIS — E78019 Familial hypercholesterolemia, unspecified: Secondary | ICD-10-CM | POA: Diagnosis not present

## 2024-08-11 ENCOUNTER — Other Ambulatory Visit: Payer: Self-pay | Admitting: Internal Medicine

## 2024-08-11 DIAGNOSIS — Z1231 Encounter for screening mammogram for malignant neoplasm of breast: Secondary | ICD-10-CM

## 2024-09-27 ENCOUNTER — Ambulatory Visit
Admission: RE | Admit: 2024-09-27 | Discharge: 2024-09-27 | Disposition: A | Source: Ambulatory Visit | Attending: Internal Medicine | Admitting: Internal Medicine

## 2024-09-27 DIAGNOSIS — Z1231 Encounter for screening mammogram for malignant neoplasm of breast: Secondary | ICD-10-CM
